# Patient Record
Sex: Male | Born: 1980
Health system: Southern US, Community
[De-identification: ages and names within clinical notes are randomized; demographics above are authoritative.]

## PROBLEM LIST (undated history)

## (undated) DIAGNOSIS — Z8619 Personal history of other infectious and parasitic diseases: Secondary | ICD-10-CM

## (undated) DIAGNOSIS — B360 Pityriasis versicolor: Secondary | ICD-10-CM

## (undated) DIAGNOSIS — R61 Generalized hyperhidrosis: Secondary | ICD-10-CM

## (undated) DIAGNOSIS — C439 Malignant melanoma of skin, unspecified: Secondary | ICD-10-CM

## (undated) HISTORY — DX: Pityriasis versicolor: B36.0

## (undated) HISTORY — DX: Generalized hyperhidrosis: R61

## (undated) HISTORY — DX: Personal history of other infectious and parasitic diseases: Z86.19

## (undated) HISTORY — DX: Malignant melanoma of skin, unspecified: C43.9

---
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
## 2017-03-13 ENCOUNTER — Encounter (INDEPENDENT_AMBULATORY_CARE_PROVIDER_SITE_OTHER): Payer: Self-pay

## 2017-03-13 ENCOUNTER — Ambulatory Visit (INDEPENDENT_AMBULATORY_CARE_PROVIDER_SITE_OTHER): Payer: PRIVATE HEALTH INSURANCE | Admitting: Podiatry

## 2017-03-13 ENCOUNTER — Encounter: Payer: Self-pay | Admitting: Podiatry

## 2017-03-13 DIAGNOSIS — M779 Enthesopathy, unspecified: Secondary | ICD-10-CM

## 2017-03-13 DIAGNOSIS — M7661 Achilles tendinitis, right leg: Secondary | ICD-10-CM

## 2017-03-13 MED ORDER — MELOXICAM 15 MG PO TABS: 15.0000 mg | ORAL_TABLET | Freq: Every day | ORAL | 2 refills | Status: DC

## 2017-03-13 NOTE — Patient Instructions (Signed)
Posterior Tibialis Tendinosis Posterior tibialis tendinosis is irritation and degeneration of a tendon called the posterior tibial tendon. Your posterior tibial tendon is a cord-like tissue that connects bones of your lower leg and foot to a muscle that:  Supports your arch.  Helps you raise up on your toes.  Helps you turn your foot down and in. This condition causes foot and ankle pain and can lead to a flat foot. What are the causes? This condition is most often caused by repeated stress to the tendon (overuse injury). It can also be caused by a sudden injury that stresses the tendon, such as landing on your foot after jumping or falling. What increases the risk? This condition is more likely to develop in:  People who play a sport that involves putting a lot of pressure on the feet, such as:  Basketball.  Tennis.  Soccer.  Hockey.  Runners.  Females who are older than 40 years and are overweight.  People with diabetes.  People with decreased foot stability (ligamentous laxity).  People with flat feet. What are the signs or symptoms? Symptoms of this condition may start suddenly or gradually. Symptoms include:  Pain in the inner ankle.  Pain at the arch of your foot.  Pain that gets worse with running, walking, or standing.  Swelling on the inside of your ankle and foot.  Weakness in your ankle or foot.  Inability to stand up on tiptoe. How is this diagnosed? This condition may be diagnosed based on:  Your symptoms.  Your medical history.  A physical exam.  Tests, such as:  An X-ray.  MRI.  An ultrasound. During the physical exam, your health care provider may move your foot and ankle, test your strength and balance, and check the arch of your foot while you stand or walk. How is this treated? This condition may be treated by:  Replacing high-impact exercise with low-impact exercise, such as swimming or cycling.  Applying ice to the injured  area.  Taking an anti-inflammatory pain medicine.  Physical therapy.  Wearing a special shoe or shoe insert to support your arch (orthotic). If your symptoms do not improve with these treatments, you may need to wear a splint, removable walking boot, or short leg cast for 6-8 weeks to keep your foot and ankle still. Follow these instructions at home: If you have a boot or splint:   Wear the boot or splint as told by your health care provider. Remove it only as told by your health care provider.  Do not use your foot to support (bear) your full body weight until your health care provider says that you can.  Loosen the boot or splint if your toes tingle, become numb, or turn cold and blue.  Keep the boot or splint clean.  If your boot or splint is not waterproof:  Do not let it get wet.  Cover it with a watertight plastic bag when you take a bath or shower. If you have a cast:   Do not stick anything inside the cast to scratch your skin. Doing that increases your risk of infection.  Check the skin around the cast every day. Tell your health care provider about any concerns.  You may put lotion on dry skin around the edges of the cast. Do not apply lotion to the skin underneath the cast.  Keep the cast clean.  Do not take baths, swim, or use a hot tub until your health care provider approves. Ask   your health care provider if you can take showers. You may only be allowed to take sponge baths for bathing.  If your cast is not waterproof:  Do not let it get wet.  Cover it with a watertight plastic bag while you take a bath or a shower. Managing pain and swelling   Take over-the-counter and prescription medicines only as told by your health care provider.  If directed, apply ice to the injured area:  Put ice in a plastic bag.  Place a towel between your skin and the bag.  Leave the ice on for 20 minutes, 2-3 times a day.  Raise (elevate) your ankle above the level of  your heart when resting if you have swelling. Activity   Do not do activities that make pain or swelling worse.  Return to full activity gradually as symptoms improve.  Do exercises as told by your health care provider. General instructions   If you have an orthotic, use it as told by your health care provider.  Keep all follow-up visits as told by your health care provider. This is important. How is this prevented?  Wear footwear that is appropriate to your athletic activity.  Avoid athletic activities that cause pain or swelling in your ankle or foot.  Before being active, do range-of-motion and stretching exercises.  If you develop pain or swelling while training, stop training.  If you have pain or swelling that does not improve after a few days of rest, see your health care provider.  If you start a new athletic activity, start gradually so you can build up your strength and flexibility. Contact a health care provider if:  Your symptoms get worse.  Your symptoms do not improve in 6-8 weeks.  You develop new, unexplained symptoms.  Your splint, boot, or cast gets damaged. This information is not intended to replace advice given to you by your health care provider. Make sure you discuss any questions you have with your health care provider. Document Released: 10/09/2005 Document Revised: 06/13/2016 Document Reviewed: 06/25/2015 Elsevier Interactive Patient Education  2017 [employer]   Achilles Tendinitis Rehab Ask your health care provider which exercises are safe for you. Do exercises exactly as told by your health care provider and adjust them as directed. It is normal to feel mild stretching, pulling, tightness, or discomfort as you do these exercises, but you should stop right away if you feel sudden pain or your pain gets worse. Do not begin these exercises until told by your health care provider. Stretching and range of motion exercises These exercises warm up  your muscles and joints and improve the movement and flexibility of your ankle. These exercises also help to relieve pain, numbness, and tingling. Exercise A: Standing wall calf stretch, knee straight   1. Stand with your hands against a wall. 2. Extend your __________ leg behind you and bend your front knee slightly. Keep both of your heels on the floor. 3. Point the toes of your back foot slightly inward. 4. Keeping your heels on the floor and your back knee straight, shift your weight toward the wall. Do not allow your back to arch. You should feel a gentle stretch in your calf. 5. Hold this position for seconds. Repeat __________ times. Complete this stretch __________ times per day. Exercise B: Standing wall calf stretch, knee bent  1. Stand with your hands against a wall. 2. Extend your __________ leg behind you, and bend your front knee slightly. Keep both of  your heels on the floor. 3. Point the toes of your back foot slightly inward. 4. Keeping your heels on the floor, unlock your back knee so that it is bent. You should feel a gentle stretch deep in your calf. 5. Hold this position for __________ seconds. Repeat __________ times. Complete this stretch __________ times per day. Strengthening exercises These exercises build strength and control of your ankle. Endurance is the ability to use your muscles for a long time, even after they get tired. Exercise C: Plantar flexion with band   1. Sit on the floor with your __________ leg extended. You may put a pillow under your calf to give your foot more room to move. 2. Loop a rubber exercise band or tube around the ball of your __________ foot. The ball of your foot is on the walking surface, right under your toes. The band or tube should be slightly tense when your foot is relaxed. If the band or tube slips, you can put on your shoe or put a washcloth between the band and your foot to help it stay in place. 3. Slowly point your toes  downward, pushing them away from you. 4. Hold this position for __________ seconds. 5. Slowly release the tension in the band or tube, controlling smoothly until your foot is back to the starting position. Repeat __________ times. Complete this exercise __________ times per day. Exercise D: Heel raise with eccentric lower   1. Stand on a step with the balls of your feet. The ball of your foot is on the walking surface, right under your toes.  Do not put your heels on the step.  For balance, rest your hands on the wall or on a railing. 2. Rise up onto the balls of your feet. 3. Keeping your heels up, shift all of your weight to your __________ leg and pick up your other leg. 4. Slowly lower your __________ leg so your heel drops below the level of the step. 5. Put down your foot. If told by your health care provider, build up to:  3 sets of 15 repetitions while keeping your knees straight.  3 sets of 15 repetitions while keeping your knees bent as far as told by your health care provider. Complete this exercise __________ times per day. If this exercise is too easy, try doing it while wearing a backpack with weights in it. Balance exercises These exercises improve or maintain your balance. Balance is important in preventing falls. Exercise E: Single leg stand  1. Without shoes, stand near a railing or in a door frame. Hold on to the railing or door frame as needed. 2. Stand on your __________ foot. Keep your big toe down on the floor and try to keep your arch lifted. 3. Hold this position for __________ seconds. Repeat __________ times. Complete this exercise __________ times per day. If this exercise is too easy, you can try it with your eyes closed or while standing on a pillow. This information is not intended to replace advice given to you by your health care provider. Make sure you discuss any questions you have with your health care provider. Document Released: 05/10/2005 Document  Revised: 06/15/2016 Document Reviewed: 06/15/2015 Elsevier Interactive Patient Education  2017 [employer]

## 2017-03-13 NOTE — Progress Notes (Signed)
Subjective:    Patient ID: Maurice Roberts, male   DOB: 36 y.o.   MRN: [id number]   HPI 36 year old male presents the also concerns of pain to the right Achilles tendon into the ankle as well. Its been ongoing for about 1 year. He states that he would like to run but he has difficulty running and a lot of activity due to the pain. He states he only has tenderness with activity has no pain at rest or today. He has notany swelling he denies any recent injury or trauma. There is no numbness or tingling. He said no recent treatment. The pain does not wake him up at night. He has no other complaints today.   Review of Systems  All other systems reviewed and are negative.       Objective:  Physical Exam General: AAO x3, NAD  Dermatological: Skin is warm, dry and supple bilateral. Nails x 10 are well manicured; remaining integument appears unremarkable at this time. There are no open sores, no preulcerative lesions, no rash or signs of infection present.  Vascular: Dorsalis Pedis artery and Posterior Tibial artery pedal pulses are 2/4 bilateral with immedate capillary fill time. Pedal hair growth present. No varicosities and no lower extremity edema present bilateral. There is no pain with calf compression, swelling, warmth, erythema.   Neruologic: Grossly intact via light touch bilateral. Vibratory intact via tuning fork bilateral. Protective threshold with Semmes Wienstein monofilament intact to all pedal sites bilateral. Negative tinel sign.   Musculoskeletal: Upon weightbearing exam there is no area of tenderness in the bilateral lower extremity is. Ankle, subtalar, midtarsal joint range of motion intact. Mild equinus is present. There is no tenderness along the course of the flexor tendons, posterior tibial tendon there is no pain on the Achilles tendon today. There is no overlying edema, erythema, increase in warmth. Thompson test is negative. Is no defect noted within the Achilles tendon.  Weightbearing evaluation does reveal in rectus right foot however on the left side he does roll in more and there is bowing of the Achilles tendon. Muscular strength 5/5 in all groups tested bilateral.  Gait: Unassisted, Nonantalgic.      Assessment:     36 year old male right Achilles tendon asking likely posterior tibial tendinitis due to biomechanical changes    Plan:      -Treatment options discussed including all alternatives, risks, and complications -Etiology of symptoms were discussed -Prescribed mobic. He can use this prn. Discussed side effects of the medication and directed to stop if any are to occur and call the office.  -I believe his symptoms are due to biomechanical changes. The right foot is rectus in the left footis rolling and more and there is flattening of the arch and rolling inward of the ankle. I believe he'll benefit more from a orthotic. He was measured for orthotics. Will check insurance coverage.  -Rehab exercises discussed. If symptoms continue discussed possible MRI, formal PT.  -If symptoms continue, will get x-rays of the ankle  -RTC 4 weeks or sooner if needed.  Ovid CurdMatthew Phoenix Dresser, DPM

## 2017-03-13 NOTE — Progress Notes (Signed)
   Subjective:    Patient ID: Christie BeckersLuke Tengan, male    DOB: Jul 05, 1981, 36 y.o.   MRN: [id number]  HPI   I have some pain and on inside of right ankle that wraps around the achilles and has been going on for about a year and worse the last 3 months and sore and tender and hurts on uneven surfaces and there is no swelling     Review of Systems  All other systems reviewed and are negative.      Objective:   Physical Exam        Assessment & Plan:

## 2017-03-15 ENCOUNTER — Telehealth: Payer: Self-pay | Admitting: *Deleted

## 2017-03-15 NOTE — Telephone Encounter (Signed)
I had Maurice Roberts call the [employer] insurance and they do not cover the orthotics and I called the patient and stated that the insurance is not going to cover them and the patient stated that he would have to discuss it with his wife and he was quoted the price of $398.00 if the insurance did not cover. Misty StanleyLisa

## 2017-03-22 ENCOUNTER — Telehealth: Payer: Self-pay | Admitting: Behavioral Health

## 2017-03-22 NOTE — Telephone Encounter (Signed)
Patient returning call patient states he will be in meetings from 2p to 3:3pm, please advise

## 2017-03-22 NOTE — Telephone Encounter (Signed)
Unable to reach patient at time of Pre-Visit Call.  Left message for patient to return call when available.    

## 2017-03-22 NOTE — Telephone Encounter (Signed)
LM x 2 for pre-visit call. 

## 2017-03-23 ENCOUNTER — Encounter: Payer: Self-pay | Admitting: Family Medicine

## 2017-03-23 ENCOUNTER — Ambulatory Visit (INDEPENDENT_AMBULATORY_CARE_PROVIDER_SITE_OTHER): Payer: PRIVATE HEALTH INSURANCE | Admitting: Family Medicine

## 2017-03-23 VITALS — BP 100/70 | HR 60 | Temp 98.2°F | Ht 68.0 in | Wt 213.4 lb

## 2017-03-23 DIAGNOSIS — E6609 Other obesity due to excess calories: Secondary | ICD-10-CM | POA: Diagnosis not present

## 2017-03-23 DIAGNOSIS — Z6832 Body mass index (BMI) 32.0-32.9, adult: Secondary | ICD-10-CM | POA: Diagnosis not present

## 2017-03-23 DIAGNOSIS — L74519 Primary focal hyperhidrosis, unspecified: Secondary | ICD-10-CM

## 2017-03-23 DIAGNOSIS — R61 Generalized hyperhidrosis: Secondary | ICD-10-CM

## 2017-03-23 NOTE — Progress Notes (Signed)
Chief Complaint  Patient presents with  . Establish Care    pt states no concerns       New Patient Visit SUBJECTIVE: HPI: Maurice Roberts is an 36 y.o.male who is being seen for establishing care.  The patient was previously seen at [place] office in [place].  Patient has situational anxiety with flight for which he takes Ativan as needed for.  He has gained 30 pounds since turning 36 years old and is undergoing efforts to improve his diet and become more active. He would like to get to 185 pounds. He is not having any chest pain or shortness of breath.  The patient also has a history of hyperhidrosis. He has used aluminum hydroxide in the past that does help, however does burn.  Allergies  Allergen Reactions  . Penicillins     Past Medical History:  Diagnosis Date  . History of chicken pox   . Hyperhidrosis   . Tinea versicolor    Past Surgical History:  Procedure Laterality Date  . KNEE SURGERY  1993   Social History   Social History  . Marital status: Married   Social History Main Topics  . Smoking status: Former Smoker    Types: Cigarettes    Quit date: 03/23/2002  . Smokeless tobacco: Never Used  . Alcohol use 1.2 oz/week    2 Cans of beer per week     Comment: Pt now drinking 5 per week  . Drug use: No   Family History  Problem Relation Age of Onset  . Cancer Maternal Grandmother        Breast      Current Outpatient Prescriptions:  .  LORazepam (ATIVAN) 1 MG tablet, Take 1 tablet by mouth before flying., Disp: , Rfl:   ROS Endo: +sweating   OBJECTIVE: BP 100/70 (BP Location: Left Arm, Patient Position: Sitting, Cuff Size: Large)   Pulse 60   Temp 98.2 F (36.8 C) (Oral)   Ht 5\' 8"  (1.727 m)   Wt 213 lb 6.4 oz (96.8 kg)   SpO2 98%   BMI 32.45 kg/m   Constitutional: -  VS reviewed -  Well developed, well nourished, appears stated age -  No apparent distress  Psychiatric: -  Oriented to person, place, and time -  Memory intact -  Affect and mood  normal -  Fluent conversation, good eye contact -  Judgment and insight age appropriate  Eye: -  Conjunctivae clear, no discharge -  Pupils symmetric, round, reactive to light  Neck: -  No gross swelling, no palpable masses -  Thyroid midline, not enlarged, mobile, no palpable masses  Cardiovascular: -  RRR, no murmurs -  No LE edema  Respiratory: -  Normal respiratory effort, no accessory muscle use, no retraction -  Breath sounds equal, no wheezes, no ronchi, no crackles   ASSESSMENT/PLAN: Class 1 obesity due to excess calories without serious comorbidity with body mass index (BMI) of 32.0 to 32.9 in adult  Hyperhidrosis  Counseled on diet and exercise. Healthy diet handout provided in paper work. Doing well overall, okay to continue to use aluminum hydroxide for hyperhidrosis as needed. Patient should return in around 10 months for a physical. The patient voiced understanding and agreement to the plan.   Jilda Rocheicholas Paul SomonaukWendling, DO 03/23/17  12:40 PM

## 2017-03-23 NOTE — Patient Instructions (Addendum)
OK to use aluminum hydroxide.  Healthy Eating Plan Many factors influence your heart health, including eating and exercise habits. Heart (coronary) risk increases with abnormal blood fat (lipid) levels. Heart-healthy meal planning includes limiting unhealthy fats, increasing healthy fats, and making other small dietary changes. This includes maintaining a healthy body weight to help keep lipid levels within a normal range.  WHAT IS MY PLAN?  Your health care provider recommends that you:  Drink a glass of water before meals to help with satiety.  Eat slowly.  An alternative to the water is to add Metamucil. This will help with satiety as well. It does contain calories, unlike water.  WHAT TYPES OF FAT SHOULD I CHOOSE?  Choose healthy fats more often. Choose monounsaturated and polyunsaturated fats, such as olive oil and canola oil, flaxseeds, walnuts, almonds, and seeds.  Eat more omega-3 fats. Good choices include salmon, mackerel, sardines, tuna, flaxseed oil, and ground flaxseeds. Aim to eat fish at least two times each week.  Avoid foods with partially hydrogenated oils in them. These contain trans fats. Examples of foods that contain trans fats are stick margarine, some tub margarines, cookies, crackers, and other baked goods. If you are going to avoid a fat, this is the one to avoid!  WHAT GENERAL GUIDELINES DO I NEED TO FOLLOW?  Check food labels carefully to identify foods with trans fats. Avoid these types of options when possible.  Fill one half of your plate with vegetables and green salads. Eat 4-5 servings of vegetables per day. A serving of vegetables equals 1 cup of raw leafy vegetables,  cup of raw or cooked cut-up vegetables, or  cup of vegetable juice.  Fill one fourth of your plate with whole grains. Look for the word "whole" as the first word in the ingredient list.  Fill one fourth of your plate with lean protein foods.  Eat 4-5 servings of fruit per day. A  serving of fruit equals one medium whole fruit,  cup of dried fruit,  cup of fresh, frozen, or canned fruit. Try to avoid fruits in cups/syrups as the sugar content can be high.  Eat more foods that contain soluble fiber. Examples of foods that contain this type of fiber are apples, broccoli, carrots, beans, peas, and barley. Aim to get 20-30 g of fiber per day.  Eat more home-cooked food and less restaurant, buffet, and fast food.  Limit or avoid alcohol.  Limit foods that are high in starch and sugar.  Avoid fried foods when able.  Cook foods by using methods other than frying. Baking, boiling, grilling, and broiling are all great options. Other fat-reducing suggestions include: ? Removing the skin from poultry. ? Removing all visible fats from meats. ? Skimming the fat off of stews, soups, and gravies before serving them. ? Steaming vegetables in water or broth.  Lose weight if you are overweight. Losing just 5-10% of your initial body weight can help your overall health and prevent diseases such as diabetes and heart disease.  Increase your consumption of nuts, legumes, and seeds to 4-5 servings per week. One serving of dried beans or legumes equals  cup after being cooked, one serving of nuts equals 1 ounces, and one serving of seeds equals  ounce or 1 tablespoon.  WHAT ARE GOOD FOODS CAN I EAT? Grains Grainy breads (try to find bread that is 3 g of fiber per slice or greater), oatmeal, light popcorn. Whole-grain cereals. Rice and pasta, including brown rice and those  that are made with whole wheat. Edamame pasta is a great alternative to grain pasta. It has a higher protein content. Try to avoid significant consumption of white bread, sugary cereals, or pastries/baked goods.  Vegetables All vegetables. Cooked white potatoes do not count as vegetables.  Fruits All fruits, but limit pineapple and bananas as these fruits have a higher sugar content.  Meats and Other Protein  Sources Lean, well-trimmed beef, veal, pork, and lamb. Chicken and [place] without skin. All fish and shellfish. Wild duck, rabbit, pheasant, and venison. Egg whites or low-cholesterol egg substitutes. Dried beans, peas, lentils, and tofu.Seeds and most nuts.  Dairy Low-fat or nonfat cheeses, including ricotta, string, and mozzarella. Skim or 1% milk that is liquid, powdered, or evaporated. Buttermilk that is made with low-fat milk. Nonfat or low-fat yogurt. Soy/Almond milk are good alternatives if you cannot handle dairy.  Beverages Water is the best for you. Sports drinks with less sugar are more desirable unless you are a highly active athlete.  Sweets and Desserts Sherbets and fruit ices. Honey, jam, marmalade, jelly, and syrups. Dark chocolate.  Eat all sweets and desserts in moderation.  Fats and Oils Nonhydrogenated (trans-free) margarines. Vegetable oils, including soybean, sesame, sunflower, olive, peanut, safflower, corn, canola, and cottonseed. Salad dressings or mayonnaise that are made with a vegetable oil. Limit added fats and oils that you use for cooking, baking, salads, and as spreads.  Other Cocoa powder. Coffee and tea. Most condiments.  The items listed above may not be a complete list of recommended foods or beverages. Contact your dietitian for more options.

## 2017-04-10 ENCOUNTER — Ambulatory Visit: Payer: PRIVATE HEALTH INSURANCE | Admitting: Podiatry

## 2017-09-23 ENCOUNTER — Emergency Department (HOSPITAL_COMMUNITY)
Admission: EM | Admit: 2017-09-23 | Discharge: 2017-09-23 | Disposition: A | Discharge status: Home / Self Care | Payer: BLUE CROSS/BLUE SHIELD | Attending: Emergency Medicine | Admitting: Emergency Medicine

## 2017-09-23 ENCOUNTER — Encounter (HOSPITAL_COMMUNITY): Payer: Self-pay

## 2017-09-23 DIAGNOSIS — Z880 Allergy status to penicillin: Secondary | ICD-10-CM | POA: Diagnosis not present

## 2017-09-23 DIAGNOSIS — Z79899 Other long term (current) drug therapy: Secondary | ICD-10-CM | POA: Diagnosis not present

## 2017-09-23 DIAGNOSIS — M5431 Sciatica, right side: Secondary | ICD-10-CM | POA: Insufficient documentation

## 2017-09-23 DIAGNOSIS — Z87891 Personal history of nicotine dependence: Secondary | ICD-10-CM | POA: Insufficient documentation

## 2017-09-23 DIAGNOSIS — M545 Low back pain: Secondary | ICD-10-CM | POA: Diagnosis present

## 2017-09-23 MED ORDER — KETOROLAC TROMETHAMINE 60 MG/2ML IM SOLN
30.0000 mg | Freq: Once | INTRAMUSCULAR | Status: AC
  Administered 2017-09-23: 30 mg via INTRAMUSCULAR
  Filled 2017-09-23: qty 2

## 2017-09-23 MED ORDER — NAPROXEN SODIUM 220 MG PO TABS: 220.0000 mg | ORAL_TABLET | Freq: Two times a day (BID) | ORAL | 0 refills | Status: AC

## 2017-09-23 MED ORDER — CYCLOBENZAPRINE HCL 10 MG PO TABS: 10.0000 mg | ORAL_TABLET | Freq: Every day | ORAL | 0 refills | Status: AC

## 2017-09-23 MED ORDER — CYCLOBENZAPRINE HCL 10 MG PO TABS
5.0000 mg | ORAL_TABLET | Freq: Once | ORAL | Status: AC
  Administered 2017-09-23: 5 mg via ORAL
  Filled 2017-09-23: qty 1

## 2017-09-23 NOTE — Discharge Instructions (Signed)
You may use over-the-counter Motrin (Ibuprofen), Acetaminophen (Tylenol), topical muscle creams such as SalonPas, Icy Hot, Bengay, etc. Please stretch, apply heat, and have massage therapy for additional assistance. ° °

## 2017-09-23 NOTE — ED Triage Notes (Signed)
Patient complains of severe lower back pain after bending over yesterday, has hx of lumbar problem in past, pain with any movement, alert and oriented

## 2017-09-23 NOTE — ED Provider Notes (Signed)
[facility] EMERGENCY DEPARTMENT Provider Note  CSN: [id number] Arrival date & time: 09/23/17 0750  Chief Complaint(s) Back Pain  HPI Maurice Roberts is a 36 y.o. male with a history of L5 herniated disc noted in early 2000 that was treated with chiropractic and rehab presents to the emergency department with right lumbosacral pain that began yesterday after bending over to pick up close off the bed.  Pain was sudden and severe in nature.  Aching/cramping/shooting.  Endorses radiation to the right foot.  Exacerbated with movement, palpation, ambulation.  Also exacerbated with certain positions.  Alleviated by being still.  Has tried taking over-the-counter Aleve and Motrin with minimal relief.  Is also applied ice and Biofreeze.  Denies any trauma, history of cancer, fevers, IV drug use, bladder/bowel incontinence, lower extremity weakness or loss of sensation.  HPI  Past Medical History Past Medical History:  Diagnosis Date  . History of chicken pox   . Hyperhidrosis   . Tinea versicolor    There are no active problems to display for this patient.  Home Medication(s) Prior to Admission medications   Medication Sig Start Date End Date Taking? Authorizing Provider  cyclobenzaprine (FLEXERIL) 10 MG tablet Take 1 tablet (10 mg total) by mouth at bedtime for 10 days. 09/23/17 10/03/17  Nira Conn, MD  LORazepam (ATIVAN) 1 MG tablet Take 1 tablet by mouth before flying.    [provider]  naproxen sodium (ALEVE) 220 MG tablet Take 1-2 tablets (220-440 mg total) by mouth 2 (two) times daily with a meal for 10 days. 09/23/17 10/03/17  Nira Conn, MD                                                                                                                                    Past Surgical History Past Surgical History:  Procedure Laterality Date  . KNEE SURGERY  1993   Family History Family History  Problem Relation Age of Onset  . Cancer  Maternal Grandmother        Breast     Social History Social History   Tobacco Use  . Smoking status: Former Smoker    Types: Cigarettes    Last attempt to quit: 03/23/2002    Years since quitting: 15.5  . Smokeless tobacco: Never Used  Substance Use Topics  . Alcohol use: Yes    Alcohol/week: 1.2 oz    Types: 2 Cans of beer per week    Comment: Pt now drinking 5 per week  . Drug use: No   Allergies Penicillins  Review of Systems Review of Systems All other systems are reviewed and are negative for acute change except as noted in the HPI  Physical Exam Vital Signs  I have reviewed the triage vital signs BP (!) 131/97   Pulse 89   Temp 99.1 F (37.3 C) (Oral)   Resp 18   SpO2 99%  Physical Exam  Constitutional: He is oriented to person, place, and time. He appears well-developed and well-nourished. No distress.  HENT:  Head: Normocephalic and atraumatic.  Right Ear: External ear normal.  Left Ear: External ear normal.  Nose: Nose normal.  Mouth/Throat: Mucous membranes are normal. No trismus in the jaw.  Eyes: Conjunctivae and EOM are normal. No scleral icterus.  Neck: Normal range of motion and phonation normal.  Cardiovascular: Normal rate and regular rhythm.  Pulmonary/Chest: Effort normal. No stridor. No respiratory distress.  Abdominal: He exhibits no distension.  Musculoskeletal: Normal range of motion. He exhibits no edema.       Lumbar back: He exhibits tenderness and spasm. He exhibits no bony tenderness.       Back:  Neurological: He is alert and oriented to person, place, and time.  Spine Exam: Strength: 5/5 throughout LE bilaterally (hip flexion/extension, adduction/abduction; knee flexion/extension; foot dorsiflexion/plantarflexion, inversion/eversion; great toe inversion) Sensation: Intact to light touch in proximal and distal LE bilaterally Reflexes: 2+ quadriceps and achilles reflexes   Skin: He is not diaphoretic.  Psychiatric: He has a  normal mood and affect. His behavior is normal.  Vitals reviewed.   ED Results and Treatments Labs (all labs ordered are listed, but only abnormal results are displayed) Labs Reviewed - No data to display                                                                                                                       EKG  EKG Interpretation  Date/Time:    Ventricular Rate:    PR Interval:    QRS Duration:   QT Interval:    QTC Calculation:   R Axis:     Text Interpretation:        Radiology No results found. Pertinent labs & imaging results that were available during my care of the patient were reviewed by me and considered in my medical decision making (see chart for details).  Medications Ordered in ED Medications  ketorolac (TORADOL) injection 30 mg (not administered)  cyclobenzaprine (FLEXERIL) tablet 5 mg (not administered)                                                                                                                                    Procedures Procedures  (including critical care time)  Medical Decision Making / ED Course I have reviewed the nursing notes for this encounter  and the patient's prior records (if available in EHR or on provided paperwork).    36 y.o. male presents with back pain in lumbar area for 2 days with signs of radicular pain. No acute traumatic onset. No red flag symptoms of fever, weight loss, saddle anesthesia, weakness, fecal/urinary incontinence or urinary retention.   Suspect muscle strain/spasm versus herniated disc etiology. No indication for imaging emergently. Patient was recommended to take short course of scheduled NSAIDs and engage in early mobility as definitive treatment. Return precautions discussed for worsening or new concerning symptoms.   The patient is safe for discharge with strict return precautions.   Final Clinical Impression(s) / ED Diagnoses Final diagnoses:  Sciatica of right side     Disposition: Discharge  Condition: Good  I have discussed the results, Dx and Tx plan with the patient who expressed understanding and agree(s) with the plan. Discharge instructions discussed at great length. The patient was given strict return precautions who verbalized understanding of the instructions. No further questions at time of discharge.    ED Discharge Orders        Ordered    naproxen sodium (ALEVE) 220 MG tablet  2 times daily with meals     09/23/17 1029    cyclobenzaprine (FLEXERIL) 10 MG tablet  Daily at bedtime     09/23/17 1029       Follow Up: Sharlene DoryWendling, Nicholas Paul, DO [address] [place] [phone]  Schedule an appointment as soon as possible for a visit in 2 weeks If symptoms do not improve or  worsen     This chart was dictated using voice recognition software.  Despite best efforts to proofread,  errors can occur which can change the documentation meaning.   Nira Connardama, Tyrice Hewitt Eduardo, MD 09/23/17 [phone]

## 2018-01-04 ENCOUNTER — Ambulatory Visit: Payer: PRIVATE HEALTH INSURANCE | Admitting: Family Medicine

## 2018-01-22 ENCOUNTER — Ambulatory Visit (INDEPENDENT_AMBULATORY_CARE_PROVIDER_SITE_OTHER): Payer: BLUE CROSS/BLUE SHIELD

## 2018-01-22 ENCOUNTER — Ambulatory Visit: Payer: PRIVATE HEALTH INSURANCE | Admitting: Podiatry

## 2018-01-22 ENCOUNTER — Encounter: Payer: Self-pay | Admitting: Podiatry

## 2018-01-22 VITALS — BP 124/83 | HR 76 | Resp 16

## 2018-01-22 DIAGNOSIS — M778 Other enthesopathies, not elsewhere classified: Secondary | ICD-10-CM

## 2018-01-22 DIAGNOSIS — M779 Enthesopathy, unspecified: Secondary | ICD-10-CM

## 2018-01-22 DIAGNOSIS — M7751 Other enthesopathy of right foot: Secondary | ICD-10-CM | POA: Diagnosis not present

## 2018-01-22 MED ORDER — MELOXICAM 15 MG PO TABS
15.0000 mg | ORAL_TABLET | Freq: Every day | ORAL | 3 refills | Status: DC
Start: 2018-01-22 — End: 2018-04-24

## 2018-01-22 MED ORDER — METHYLPREDNISOLONE 4 MG PO TBPK: ORAL_TABLET | ORAL | 0 refills | Status: DC

## 2018-01-22 NOTE — Progress Notes (Signed)
  Subjective:  Patient ID: Maurice Roberts, male    DOB: 08/04/1981,  MRN: [id number] HPI Chief Complaint  Patient presents with  . Ankle Pain    Medial ankle right - initially started in achilles and now has moved into ankle - 9 months, no injury, says right leg is under developed "missing ligament", tried PT at home-not much help, [facility]    37 y.o. male presents with the above complaint.   ROS: Denies fever chills nausea vomiting muscle aches pains calf pain chest pain shortness of breath.  Past Medical History:  Diagnosis Date  . History of chicken pox   . Hyperhidrosis   . Tinea versicolor    Past Surgical History:  Procedure Laterality Date  . KNEE SURGERY  1993    Current Outpatient Medications:  .  meloxicam (MOBIC) 15 MG tablet, Take 1 tablet (15 mg total) by mouth daily., Disp: 30 tablet, Rfl: 3 .  methylPREDNISolone (MEDROL DOSEPAK) 4 MG TBPK tablet, 6 day dose pack - take as directed, Disp: 21 tablet, Rfl: 0  Allergies  Allergen Reactions  . Penicillins    Review of Systems Objective:   Vitals:   01/22/18 1447  BP: 124/83  Pulse: 76  Resp: 16    General: Well developed, nourished, in no acute distress, alert and oriented x3   Dermatological: Skin is warm, dry and supple bilateral. Nails x 10 are well maintained; remaining integument appears unremarkable at this time. There are no open sores, no preulcerative lesions, no rash or signs of infection present.  Vascular: Dorsalis Pedis artery and Posterior Tibial artery pedal pulses are 2/4 bilateral with immedate capillary fill time. Pedal hair growth present. No varicosities and no lower extremity edema present bilateral.   Neruologic: Grossly intact via light touch bilateral. Vibratory intact via tuning fork bilateral. Protective threshold with Semmes Wienstein monofilament intact to all pedal sites bilateral. Patellar and Achilles deep tendon reflexes 2+ bilateral. No Babinski or clonus  noted bilateral.   Musculoskeletal: No gross boney pedal deformities bilateral. No pain, crepitus, or limitation noted with foot and ankle range of motion bilateral. Muscular strength 5/5 in all groups tested bilateral.  Gait: Unassisted, Nonantalgic.    Radiographs:  Radiographs taken today demonstrate an osseously mature individual.  There is a small fracture fragment or accessory ossicle to the dorsal aspect of the talus of the right foot.  The AP view of the ankle demonstrates a ball-and-socket type joint destruction however lateral view does demonstrate the typical anterior posterior ankle socket.  There appears to be some fraying or lipping of the margins of the medial malleolus anteriorly.  Nothing strikingly appears abnormal otherwise.  Assessment & Plan:   Assessment: Capsulitis possible early osteoarthritis ankle right.  Medial gutter.  Plan: Discussed etiology pathology conservative versus surgical therapies.  At this point after sterile Betadine skin prep I injected the medial gutter with Kenalog 20 mg and 5 mg of Marcaine point maximal tenderness.  Start him on a 4 mg Medrol Dosepak to be followed by 15 mg of meloxicam after completing the initial pack.  I also would like to see him into a pair of orthotics and we are requesting precertification from insurance.  I will follow-up with him in a  month     Max T. AshawayHyatt, [place] Dakota[place]

## 2018-01-29 ENCOUNTER — Ambulatory Visit (INDEPENDENT_AMBULATORY_CARE_PROVIDER_SITE_OTHER): Payer: BLUE CROSS/BLUE SHIELD | Admitting: Orthotics

## 2018-01-29 DIAGNOSIS — M778 Other enthesopathies, not elsewhere classified: Secondary | ICD-10-CM

## 2018-01-29 DIAGNOSIS — M7750 Other enthesopathy of unspecified foot: Secondary | ICD-10-CM

## 2018-01-29 DIAGNOSIS — M779 Enthesopathy, unspecified: Secondary | ICD-10-CM

## 2018-01-29 NOTE — Progress Notes (Signed)
Pateint presents today based upon recommendation Dr Al CorpusHyatt for f/o.  Patient has hx of achillies ankle pain moving anteriory along medial mall super toward dorsum.   Patietn would beneift from arch hugging semi rigid device with 4* varus RF post and 1/8" lift RIGHT.

## 2018-02-19 ENCOUNTER — Ambulatory Visit: Payer: BLUE CROSS/BLUE SHIELD | Admitting: Orthotics

## 2018-02-19 DIAGNOSIS — M7661 Achilles tendinitis, right leg: Secondary | ICD-10-CM

## 2018-02-19 DIAGNOSIS — M779 Enthesopathy, unspecified: Secondary | ICD-10-CM

## 2018-02-19 NOTE — Progress Notes (Signed)
Patient came in today to pick up custom made foot orthotics.  The goals were accomplished and the patient reported no dissatisfaction with said orthotics.  Patient was advised of breakin period and how to report any issues. 

## 2018-03-21 ENCOUNTER — Telehealth: Payer: Self-pay | Admitting: Podiatry

## 2018-03-21 DIAGNOSIS — M7750 Other enthesopathy of unspecified foot: Secondary | ICD-10-CM

## 2018-03-21 NOTE — Telephone Encounter (Signed)
Pt called and left voicemail asking about ordering a second pair of orthotics made like the last ones.   I called pt and pt would like to proceed with ordering a second pair and is aware they are 199.00.  Raiford Noble is to order them and I will call pt when they come in.

## 2018-04-22 ENCOUNTER — Telehealth: Payer: Self-pay | Admitting: Podiatry

## 2018-04-22 NOTE — Telephone Encounter (Signed)
Pt left message asking if we could write a letter of medical necessity for the orthotics. His flex spending account is requesting this. Is it ok for us to write this letter.

## 2018-04-23 NOTE — Telephone Encounter (Signed)
Maurice BoerVicki had templete and has printed it for pt and I am mailing it to the pt. Pt is aware

## 2018-04-23 NOTE — Telephone Encounter (Signed)
There is a template letter for this somewhere.

## 2018-04-24 ENCOUNTER — Encounter: Payer: Self-pay | Admitting: Family Medicine

## 2018-04-24 ENCOUNTER — Ambulatory Visit: Payer: BLUE CROSS/BLUE SHIELD | Admitting: Family Medicine

## 2018-04-24 ENCOUNTER — Encounter (INDEPENDENT_AMBULATORY_CARE_PROVIDER_SITE_OTHER): Payer: Self-pay

## 2018-04-24 VITALS — BP 118/86 | HR 71 | Temp 98.5°F | Ht 68.0 in | Wt 221.5 lb

## 2018-04-24 DIAGNOSIS — F418 Other specified anxiety disorders: Secondary | ICD-10-CM

## 2018-04-24 MED ORDER — LORAZEPAM 1 MG PO TABS: ORAL_TABLET | ORAL | 1 refills | Status: DC

## 2018-04-24 NOTE — Progress Notes (Signed)
Chief Complaint  Patient presents with  . Medication Refill    Subjective: Patient is a 37 y.o. male here for situational anxiety.  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  Historically, he has taken Ativan around 1 hour prior to flights which does help.  He states that 10 tablets will normally last him 1 year.  He does not take it outside of flying.   ROS: Psych: As noted in HPI  Past Medical History:  Diagnosis Date  . History of chicken pox   . Hyperhidrosis   . Tinea versicolor     Objective: BP 118/86 (BP Location: Left Arm, Patient Position: Sitting, Cuff Size: Large)   Pulse 71   Temp 98.5 F (36.9 C) (Oral)   Ht 5\' 8"  (1.727 m)   Wt 221 lb 8 oz (100.5 kg)   SpO2 96%   BMI 33.68 kg/m  General: Awake, appears stated age HEENT: MMM Lungs: No accessory muscle use Psych: Age appropriate judgment and insight, normal affect and mood  Assessment and Plan: Situational anxiety - Plan: LORazepam (ATIVAN) 1 MG tablet  Ativan given for prn use for flights. F/u as originally scheduled.  The patient voiced understanding and agreement to the plan.  Jilda Rocheicholas Paul [place], DO 04/24/18  4:57 PM

## 2018-04-24 NOTE — Progress Notes (Signed)
Pre visit review using our clinic review tool, if applicable. No additional management support is needed unless otherwise documented below in the visit note. 

## 2018-04-24 NOTE — Patient Instructions (Signed)
Let us know if you need anything.  

## 2018-05-31 ENCOUNTER — Encounter: Payer: BLUE CROSS/BLUE SHIELD | Admitting: Family Medicine

## 2018-07-29 ENCOUNTER — Encounter: Payer: Self-pay | Admitting: Family Medicine

## 2018-07-29 ENCOUNTER — Ambulatory Visit (INDEPENDENT_AMBULATORY_CARE_PROVIDER_SITE_OTHER): Payer: BLUE CROSS/BLUE SHIELD | Admitting: Family Medicine

## 2018-07-29 VITALS — BP 114/84 | HR 74 | Temp 98.5°F | Ht 68.0 in | Wt 222.4 lb

## 2018-07-29 DIAGNOSIS — Z114 Encounter for screening for human immunodeficiency virus [HIV]: Secondary | ICD-10-CM | POA: Diagnosis not present

## 2018-07-29 DIAGNOSIS — Z0000 Encounter for general adult medical examination without abnormal findings: Secondary | ICD-10-CM

## 2018-07-29 DIAGNOSIS — Z23 Encounter for immunization: Secondary | ICD-10-CM

## 2018-07-29 MED ORDER — HYDROCORTISONE 2.5 % RE CREA: 1.0000 "application " | TOPICAL_CREAM | Freq: Two times a day (BID) | RECTAL | 0 refills | Status: DC

## 2018-07-29 NOTE — Progress Notes (Signed)
Pre visit review using our clinic review tool, if applicable. No additional management support is needed unless otherwise documented below in the visit note. 

## 2018-07-29 NOTE — Progress Notes (Signed)
Chief Complaint  Patient presents with  . Annual Exam    Well Male Maurice Roberts is here for a complete physical.   His last physical was >1 year ago.  Current diet: in general, a "healthy" diet.   Current exercise: cardio, lifting Weight trend: stable  Daytime fatigue? No. Seat belt? Yes.    Health maintenance Tetanus- No HIV- No  Past Medical History:  Diagnosis Date  . History of chicken pox   . Hyperhidrosis   . Tinea versicolor      Past Surgical History:  Procedure Laterality Date  . KNEE SURGERY  1993    Medications  Current Outpatient Medications on File Prior to Visit  Medication Sig Dispense Refill  . LORazepam (ATIVAN) 1 MG tablet Take 1 tablet by mouth before flying. (Patient not taking: Reported on 07/29/2018) 20 tablet 1   Allergies Allergies  Allergen Reactions  . Penicillins     Family History Family History  Problem Relation Age of Onset  . Cancer Maternal Grandmother        Breast     Review of Systems: Constitutional: no fevers or chills Eye:  no recent significant change in vision Ear/Nose/Mouth/Throat:  Ears:  no tinnitus or hearing loss Nose/Mouth/Throat:  no complaints of nasal congestion, no sore throat Cardiovascular:  no chest pain, no palpitations Respiratory:  no cough and no shortness of breath Gastrointestinal:  no abdominal pain, no change in bowel habits GU:  Male: negative for dysuria, frequency, and incontinence and negative for prostate symptoms Musculoskeletal/Extremities: +right sided cramping; otherwise no pain, redness, or swelling of the joints Integumentary (Skin/Breast):  no abnormal skin lesions reported Neurologic:  no headaches, no numbness, tingling Endocrine: No unexpected weight changes Hematologic/Lymphatic:  no night sweats  Exam BP 114/84 (BP Location: Left Arm, Patient Position: Sitting, Cuff Size: Large)   Pulse 74   Temp 98.5 F (36.9 C) (Oral)   Ht 5\' 8"  (1.727 m)   Wt 222 lb 6 oz (100.9 kg)    SpO2 97%   BMI 33.81 kg/m  General:  well developed, well nourished, in no apparent distress Skin:  no significant moles, warts, or growths Head:  no masses, lesions, or tenderness Eyes:  pupils equal and round, sclera anicteric without injection Ears:  canals without lesions, TMs shiny without retraction, no obvious effusion, no erythema Nose:  nares patent, septum midline, mucosa normal Throat/Pharynx:  lips and gingiva without lesion; tongue and uvula midline; non-inflamed pharynx; no exudates or postnasal drainage Neck: neck supple without adenopathy, thyromegaly, or masses Lungs:  clear to auscultation, breath sounds equal bilaterally, no respiratory distress Cardio:  regular rate and rhythm, no bruits, no LE edema Abdomen:  abdomen soft, nontender; bowel sounds normal; no masses or organomegaly; neg Murphy's Genital (male): circumcised penis, no lesions or discharge; testes present bilaterally without masses or tenderness Rectal: Deferred Musculoskeletal:  symmetrical muscle groups noted without atrophy or deformity; no side pain Extremities:  no clubbing, cyanosis, or edema, no deformities, no skin discoloration Neuro:  gait normal; deep tendon reflexes normal and symmetric Psych: well oriented with normal range of affect and appropriate judgment/insight  Assessment and Plan  Well adult exam - Plan: CBC, Comprehensive metabolic panel, Lipid panel, Magnesium  Need for influenza vaccination - Plan: Flu Vaccine QUAD 6+ mos PF IM (Fluarix Quad PF)  Screening for HIV (human immunodeficiency virus) - Plan: HIV Antibody (routine testing w rflx)  Need for tetanus booster - Plan: Tdap vaccine greater than or equal to 7yo  IM   Well 37 y.o. male. Counseled on diet and exercise. Update imms. Stretch, consider pickle juice, ck lytes, stay hydrated. Other orders as above. Follow up in 1 year pending the above workup. The patient voiced understanding and agreement to the  plan.  Jilda Roche Hersey, DO 07/29/18 4:25 PM

## 2018-07-29 NOTE — Patient Instructions (Addendum)
Give [place] 2-3 business days to get the results of your labs back.   Heat (pad or rice pillow in microwave) over affected area, 10-15 minutes twice daily.   Stretch the area 3 times weekly. Consider pickle juice.   Do monthly self testicular checks in the shower. You are feeling for lumps/bumps that don't belong. If you feel anything like this, let me know!  Let us know if you need anything.

## 2018-07-30 LAB — COMPREHENSIVE METABOLIC PANEL
ALT: 26 U/L (ref 0–53)
AST: 19 U/L (ref 0–37)
Albumin: 4.7 g/dL (ref 3.5–5.2)
Alkaline Phosphatase: 45 U/L (ref 39–117)
BUN: 12 mg/dL (ref 6–23)
CO2: 28 mEq/L (ref 19–32)
Calcium: 9.4 mg/dL (ref 8.4–10.5)
Chloride: 101 mEq/L (ref 96–112)
Creatinine, Ser: 0.89 mg/dL (ref 0.40–1.50)
GFR: 100.77 mL/min (ref >60.00)
Glucose, Bld: 94 mg/dL (ref 70–99)
Potassium: 4 mEq/L (ref 3.5–5.1)
Sodium: 138 mEq/L (ref 135–145)
Total Bilirubin: 1.1 mg/dL (ref 0.2–1.2)
Total Protein: 7.3 g/dL (ref 6.0–8.3)

## 2018-07-30 LAB — LIPID PANEL
Cholesterol: 203 mg/dL — ABNORMAL HIGH (ref 0–200)
HDL: 49.9 mg/dL (ref >39.00)
LDL Cholesterol: 134 mg/dL — ABNORMAL HIGH (ref 0–99)
NonHDL: 153
Total CHOL/HDL Ratio: 4
Triglycerides: 97 mg/dL (ref 0.0–149.0)
VLDL: 19.4 mg/dL (ref 0.0–40.0)

## 2018-07-30 LAB — HIV ANTIBODY (ROUTINE TESTING W REFLEX): HIV 1&2 Ab, 4th Generation: NONREACTIVE

## 2018-07-30 LAB — CBC
HCT: 47.4 % (ref 39.0–52.0)
Hemoglobin: 16.2 g/dL (ref 13.0–17.0)
MCHC: 34.1 g/dL (ref 30.0–36.0)
MCV: 91.5 fl (ref 78.0–100.0)
Platelets: 220 10*3/uL (ref 150.0–400.0)
RBC: 5.18 Mil/uL (ref 4.22–5.81)
RDW: 12.5 % (ref 11.5–15.5)
WBC: 7.4 10*3/uL (ref 4.0–10.5)

## 2018-07-30 LAB — MAGNESIUM: Magnesium: 2.1 mg/dL (ref 1.5–2.5)

## 2018-09-02 ENCOUNTER — Encounter: Payer: Self-pay | Admitting: Podiatry

## 2018-09-02 ENCOUNTER — Ambulatory Visit: Payer: BLUE CROSS/BLUE SHIELD | Admitting: Podiatry

## 2018-09-02 DIAGNOSIS — L600 Ingrowing nail: Secondary | ICD-10-CM | POA: Diagnosis not present

## 2018-09-02 NOTE — Patient Instructions (Signed)

## 2018-09-04 DIAGNOSIS — L600 Ingrowing nail: Secondary | ICD-10-CM | POA: Insufficient documentation

## 2018-09-04 NOTE — Progress Notes (Signed)
Subjective: 37 year old male presents the office today for concerns of ingrown toenail to the right big toe, lateral aspect which is been ongoing for some time is been getting worse since last week.  He did have some drainage coming from the area denies any red streaks or any other issues.  Is painful to shoes.  No other concerns. Denies any systemic complaints such as fevers, chills, nausea, vomiting. No acute changes since last appointment, and no other complaints at this time.   Objective: AAO x3, NAD DP/PT pulses palpable bilaterally, CRT less than 3 seconds Incurvation present to the lateral aspect the right hallux toenail with tenderness palpation.  Localized edema and erythema but this is more from inflammation as opposed to infection.  No ascending cellulitis.  Is no fluctuation or crepitation or any malodor.  No clinical signs of infection noted today otherwise. No open lesions or pre-ulcerative lesions.  No pain with calf compression, swelling, warmth, erythema  Assessment: Right hallux lateral border ingrown toenail  Plan: -All treatment options discussed with the patient including all alternatives, risks, complications.  -Etiology of symptoms were discussed -At this time, the patient is requesting partial nail removal with chemical matricectomy to the symptomatic portion of the nail. Risks and complications were discussed with the patient for which they understand and written consent was obtained. Under sterile conditions a total of 3 mL of a mixture of 2% lidocaine plain and 0.5% Marcaine plain was infiltrated in a hallux block fashion. Once anesthetized, the skin was prepped in sterile fashion. A tourniquet was then applied. Next the lateral aspect of hallux nail border was then sharply excised making sure to remove the entire offending nail border. Once the nails were ensured to be removed area was debrided and the underlying skin was intact. There is no purulence identified in the  procedure. Next phenol was then applied under standard conditions and copiously irrigated. Silvadene was applied. A dry sterile dressing was applied. After application of the dressing the tourniquet was removed and there is found to be an immediate capillary refill time to the digit. The patient tolerated the procedure well any complications. Post procedure instructions were discussed the patient for which he verbally understood. Follow-up in one week for nail check or sooner if any problems are to arise. Discussed signs/symptoms of infection and directed to call the office immediately should any occur or go directly to the emergency room. In the meantime, encouraged to call the office with any questions, concerns, changes symptoms. -Patient encouraged to call the office with any questions, concerns, change in symptoms.   Vivi BarrackMatthew R Arrion Broaddus DPM

## 2018-09-10 ENCOUNTER — Ambulatory Visit (INDEPENDENT_AMBULATORY_CARE_PROVIDER_SITE_OTHER): Payer: BLUE CROSS/BLUE SHIELD | Admitting: Podiatry

## 2018-09-10 DIAGNOSIS — L600 Ingrowing nail: Secondary | ICD-10-CM

## 2018-09-10 NOTE — Patient Instructions (Signed)

## 2018-09-11 NOTE — Progress Notes (Signed)
Patient is here today for follow-up appointment, recent procedure performed on 09/02/2018, removal of right hallux lateral border ingrown toenail.  He states that overall the areas healing well and is not having any pain at this time.  No redness, no no erythema, no swelling, no drainage, no other signs of infection.  Area is healing well and scabbed over at this time.  Discussed signs and symptoms of infection.  Verbal and written instructions were given to the patient.  He is to follow-up as needed with any acute symptom changes.

## 2018-09-18 ENCOUNTER — Encounter: Payer: Self-pay | Admitting: Podiatry

## 2018-09-18 ENCOUNTER — Ambulatory Visit: Payer: BLUE CROSS/BLUE SHIELD | Admitting: Podiatry

## 2018-09-18 DIAGNOSIS — L03031 Cellulitis of right toe: Secondary | ICD-10-CM | POA: Diagnosis not present

## 2018-09-18 MED ORDER — CEPHALEXIN 500 MG PO CAPS: 500.0000 mg | ORAL_CAPSULE | Freq: Two times a day (BID) | ORAL | 0 refills | Status: DC

## 2018-09-18 NOTE — Progress Notes (Signed)
Subjective:   Patient ID: Maurice Roberts, male   DOB: 37 y.o.   MRN: [id number]   HPI Patient admits that he did stop soaking his big toe and that he developed some redness and there is some crusted tissue in the border and it is mildly sore and is not noted active drainage   ROS      Objective:  Physical Exam  Neurovascular status intact with patient's right hallux lateral side showing some localized redness with crusted tissue formation with no proximal edema erythema drainage noted     Assessment:  Paronychia infection right hallux lateral border with patient having stopped soaking probably earlier than he should have     Plan:  Today using sterile instrumentation I debrided out the border and I was able to remove the crusted tissue to allow drainage and I placed him on cephalexin 500 mg twice daily for a week along with continued soaks.  If redness should persist or get worse he is to be seen back immediately

## 2018-10-09 ENCOUNTER — Telehealth: Payer: Self-pay | Admitting: Podiatry

## 2018-10-09 MED ORDER — CEPHALEXIN 500 MG PO CAPS: 500.0000 mg | ORAL_CAPSULE | Freq: Two times a day (BID) | ORAL | 0 refills | Status: DC

## 2018-10-09 NOTE — Addendum Note (Signed)
Addended by: Alphia Kava'CONNELL, Kynedi Profitt D on: 10/09/2018 02:03 PM   Modules accepted: Orders

## 2018-10-09 NOTE — Telephone Encounter (Signed)
Left message informing pt of Dr. Beverlee Nimsegal's refill of the antibiotic.

## 2018-10-09 NOTE — Telephone Encounter (Signed)
Pt called to see if he could get a refill on medication for toe infection to hold him over until his appt. on 12/20. Please give pt a call.

## 2018-10-11 ENCOUNTER — Encounter: Payer: Self-pay | Admitting: Podiatry

## 2018-10-11 ENCOUNTER — Ambulatory Visit (INDEPENDENT_AMBULATORY_CARE_PROVIDER_SITE_OTHER): Payer: BLUE CROSS/BLUE SHIELD | Admitting: Podiatry

## 2018-10-11 DIAGNOSIS — L600 Ingrowing nail: Secondary | ICD-10-CM | POA: Diagnosis not present

## 2018-10-11 NOTE — Patient Instructions (Signed)

## 2018-10-13 NOTE — Progress Notes (Signed)
Subjective: 37 year old male presents the office today for concerns of recurrent ingrown toenail to the right hallux, lateral aspect.  He states that he was doing well for some time however it started to get infected again he started antibiotics yesterday.  He noticed some increasing redness and some swelling to the area but no drainage or pus.  No recent injury. Denies any systemic complaints such as fevers, chills, nausea, vomiting. No acute changes since last appointment, and no other complaints at this time.   Objective: AAO x3, NAD DP/PT pulses palpable bilaterally, CRT less than 3 seconds There is localized edema and erythema present to the lateral aspect of the right hallux toenail but there is no drainage or pus identified today and there is no fluctuation or crepitation.  No ascending cellulitis.  Tenderness palpation of the lateral corner.  No open lesions or pre-ulcerative lesions.  No pain with calf compression, swelling, warmth, erythema  Assessment: Recurrent right lateral hallux ingrown toenail  Plan: -All treatment options discussed with the patient including all alternatives, risks, complications.  -At this time, the patient is requesting partial nail removal with chemical matricectomy to the symptomatic portion of the nail. Risks and complications were discussed with the patient for which they understand and written consent was obtained. Under sterile conditions a total of 3 mL of a mixture of 2% lidocaine plain and 0.5% Marcaine plain was infiltrated in a hallux block fashion. Once anesthetized, the skin was prepped in sterile fashion. A tourniquet was then applied. Next the lateral aspect of hallux nail border was then sharply excised making sure to remove the entire offending nail border. Once the nails were ensured to be removed area was debrided and the underlying skin was intact. There is no purulence identified in the procedure. Next phenol was then applied under standard  conditions and copiously irrigated. Silvadene was applied. A dry sterile dressing was applied. After application of the dressing the tourniquet was removed and there is found to be an immediate capillary refill time to the digit. The patient tolerated the procedure well any complications. Post procedure instructions were discussed the patient for which he verbally understood. Follow-up in one week for nail check or sooner if any problems are to arise. Discussed signs/symptoms of infection and directed to call the office immediately should any occur or go directly to the emergency room. In the meantime, encouraged to call the office with any questions, concerns, changes symptoms. -Finish course of antibiotics. -Patient encouraged to call the office with any questions, concerns, change in symptoms.   Maurice Roberts DPM

## 2018-10-18 ENCOUNTER — Ambulatory Visit: Payer: BLUE CROSS/BLUE SHIELD | Admitting: Podiatry

## 2019-06-20 ENCOUNTER — Telehealth: Payer: Self-pay | Admitting: Family Medicine

## 2019-06-20 NOTE — Telephone Encounter (Signed)
LVM for pt to reschedule 10-08, provider not in office this day and needing to reschedule.

## 2019-07-31 ENCOUNTER — Encounter: Payer: Self-pay | Admitting: Family Medicine

## 2019-08-08 ENCOUNTER — Encounter: Payer: Self-pay | Admitting: Family Medicine

## 2019-08-08 ENCOUNTER — Ambulatory Visit (INDEPENDENT_AMBULATORY_CARE_PROVIDER_SITE_OTHER): Payer: BLUE CROSS/BLUE SHIELD | Admitting: Family Medicine

## 2019-08-08 ENCOUNTER — Other Ambulatory Visit: Payer: Self-pay

## 2019-08-08 VITALS — BP 110/80 | HR 75 | Temp 97.0°F | Ht 68.5 in | Wt 224.2 lb

## 2019-08-08 DIAGNOSIS — Z23 Encounter for immunization: Secondary | ICD-10-CM | POA: Diagnosis not present

## 2019-08-08 DIAGNOSIS — Z0000 Encounter for general adult medical examination without abnormal findings: Secondary | ICD-10-CM

## 2019-08-08 DIAGNOSIS — Z125 Encounter for screening for malignant neoplasm of prostate: Secondary | ICD-10-CM

## 2019-08-08 LAB — COMPREHENSIVE METABOLIC PANEL
ALT: 35 U/L (ref 0–53)
AST: 22 U/L (ref 0–37)
Albumin: 4.6 g/dL (ref 3.5–5.2)
Alkaline Phosphatase: 46 U/L (ref 39–117)
BUN: 13 mg/dL (ref 6–23)
CO2: 29 mEq/L (ref 19–32)
Calcium: 9.2 mg/dL (ref 8.4–10.5)
Chloride: 103 mEq/L (ref 96–112)
Creatinine, Ser: 0.9 mg/dL (ref 0.40–1.50)
GFR: 93.11 mL/min (ref >60.00)
Glucose, Bld: 91 mg/dL (ref 70–99)
Potassium: 4.2 mEq/L (ref 3.5–5.1)
Sodium: 139 mEq/L (ref 135–145)
Total Bilirubin: 0.9 mg/dL (ref 0.2–1.2)
Total Protein: 6.9 g/dL (ref 6.0–8.3)

## 2019-08-08 LAB — LIPID PANEL
Cholesterol: 203 mg/dL — ABNORMAL HIGH (ref 0–200)
HDL: 42.3 mg/dL (ref >39.00)
LDL Cholesterol: 141 mg/dL — ABNORMAL HIGH (ref 0–99)
NonHDL: 160.92
Total CHOL/HDL Ratio: 5
Triglycerides: 100 mg/dL (ref 0.0–149.0)
VLDL: 20 mg/dL (ref 0.0–40.0)

## 2019-08-08 LAB — CBC
HCT: 48.8 % (ref 39.0–52.0)
Hemoglobin: 16.5 g/dL (ref 13.0–17.0)
MCHC: 33.7 g/dL (ref 30.0–36.0)
MCV: 92.3 fl (ref 78.0–100.0)
Platelets: 219 10*3/uL (ref 150.0–400.0)
RBC: 5.28 Mil/uL (ref 4.22–5.81)
RDW: 12.4 % (ref 11.5–15.5)
WBC: 6.4 10*3/uL (ref 4.0–10.5)

## 2019-08-08 LAB — PSA: PSA: 0.41 ng/mL (ref 0.10–4.00)

## 2019-08-08 NOTE — Patient Instructions (Signed)
Give us 2-3 business days to get the results of your labs back.   Keep the diet clean and stay active.  Let us know if you need anything. 

## 2019-08-08 NOTE — Progress Notes (Signed)
Chief Complaint  Patient presents with  . Annual Exam    Well Male Maurice Roberts is here for a complete physical.   His last physical was >1 year ago.  Current diet: in general, diet is fair.   Current exercise: cycling, wt resistance at gym Weight trend: stable Daytime fatigue? No. Seat belt? Yes.    Health maintenance Tetanus- Yes HIV- Yes  Past Medical History:  Diagnosis Date  . History of chicken pox   . Hyperhidrosis   . Tinea versicolor      Past Surgical History:  Procedure Laterality Date  . KNEE SURGERY  1993    Medications  Takes no meds routinely.   Allergies Allergies  Allergen Reactions  . Penicillins     Family History Family History  Problem Relation Age of Onset  . Cancer Maternal Grandmother        Breast     Review of Systems: Constitutional: no fevers or chills Eye:  no recent significant change in vision Ear/Nose/Mouth/Throat:  Ears:  no hearing loss Nose/Mouth/Throat:  no complaints of nasal congestion, no sore throat Cardiovascular:  no chest pain Respiratory:  no shortness of breath Gastrointestinal:  no abdominal pain, no change in bowel habits GU:  Male: negative for dysuria, frequency, and incontinence Musculoskeletal/Extremities:  no pain of the joints Integumentary (Skin/Breast):  no abnormal skin lesions reported Neurologic:  no headaches Endocrine: No unexpected weight changes Hematologic/Lymphatic:  no night sweats  Exam BP 110/80 (BP Location: Left Arm, Patient Position: Sitting, Cuff Size: Normal)   Pulse 75   Temp (!) 97 F (36.1 C) (Temporal)   Ht 5' 8.5" (1.74 m)   Wt 224 lb 4 oz (101.7 kg)   SpO2 98%   BMI 33.60 kg/m  General:  well developed, well nourished, in no apparent distress Skin:  no significant moles, warts, or growths Head:  no masses, lesions, or tenderness Eyes:  pupils equal and round, sclera anicteric without injection Ears:  canals without lesions, TMs shiny without retraction, no obvious  effusion, no erythema Nose:  nares patent, septum midline, mucosa normal Throat/Pharynx:  lips and gingiva without lesion; tongue and uvula midline; non-inflamed pharynx; no exudates or postnasal drainage Neck: neck supple without adenopathy, thyromegaly, or masses Lungs:  clear to auscultation, breath sounds equal bilaterally, no respiratory distress Cardio:  regular rate and rhythm, no bruits, no LE edema Abdomen:  abdomen soft, nontender; bowel sounds normal; no masses or organomegaly Rectal: Deferred Musculoskeletal:  symmetrical muscle groups noted without atrophy or deformity Extremities:  no clubbing, cyanosis, or edema, no deformities, no skin discoloration Neuro:  gait normal; deep tendon reflexes normal and symmetric Psych: well oriented with normal range of affect and appropriate judgment/insight  Assessment and Plan  Well adult exam - Plan: CBC, Comprehensive metabolic panel, Lipid panel  Need for influenza vaccination - Plan: Flu Vaccine QUAD 6+ mos PF IM (Fluarix Quad PF)  Screening for prostate cancer - Plan: PSA   Well 38 y.o. male. Counseled on diet and exercise. Counseled on risks and benefits of prostate cancer screening with PSA. The patient agrees to undergo screening.  Other orders as above. Follow up in 1 year pending the above workup. The patient voiced understanding and agreement to the plan.  [place], DO 08/08/19 9:19 AM

## 2019-08-11 ENCOUNTER — Other Ambulatory Visit: Payer: Self-pay | Admitting: Family Medicine

## 2019-08-11 DIAGNOSIS — E785 Hyperlipidemia, unspecified: Secondary | ICD-10-CM

## 2019-09-01 ENCOUNTER — Telehealth: Payer: Self-pay

## 2019-09-01 ENCOUNTER — Other Ambulatory Visit: Payer: Self-pay | Admitting: Family Medicine

## 2019-09-01 DIAGNOSIS — Z129 Encounter for screening for malignant neoplasm, site unspecified: Secondary | ICD-10-CM

## 2019-09-01 NOTE — Telephone Encounter (Signed)
Copied from [place] [id number]. Topic: General - Other >> Sep 01, 2019  7:07 AM Carolyn Stare wrote: Pt said when he had his CPE in Oct he was told if he wanted to see a dermatologist to call back and let Dr Nani Ravens know, he would like that referral for some moles and he has a history of skin cancer

## 2019-09-01 NOTE — Telephone Encounter (Signed)
OK to refer.

## 2019-09-01 NOTE — Telephone Encounter (Signed)
done

## 2020-02-09 ENCOUNTER — Other Ambulatory Visit (INDEPENDENT_AMBULATORY_CARE_PROVIDER_SITE_OTHER): Payer: BLUE CROSS/BLUE SHIELD

## 2020-02-09 ENCOUNTER — Other Ambulatory Visit: Payer: Self-pay

## 2020-02-09 DIAGNOSIS — E785 Hyperlipidemia, unspecified: Secondary | ICD-10-CM | POA: Diagnosis not present

## 2020-02-09 LAB — LIPID PANEL
Cholesterol: 150 mg/dL (ref 0–200)
HDL: 42.5 mg/dL (ref >39.00)
LDL Cholesterol: 99 mg/dL (ref 0–99)
NonHDL: 107.85
Total CHOL/HDL Ratio: 4
Triglycerides: 45 mg/dL (ref 0.0–149.0)
VLDL: 9 mg/dL (ref 0.0–40.0)

## 2020-04-02 ENCOUNTER — Other Ambulatory Visit: Payer: Self-pay

## 2020-04-02 ENCOUNTER — Ambulatory Visit (INDEPENDENT_AMBULATORY_CARE_PROVIDER_SITE_OTHER): Payer: BLUE CROSS/BLUE SHIELD | Admitting: Podiatry

## 2020-04-02 DIAGNOSIS — M7661 Achilles tendinitis, right leg: Secondary | ICD-10-CM

## 2020-04-02 DIAGNOSIS — M722 Plantar fascial fibromatosis: Secondary | ICD-10-CM | POA: Diagnosis not present

## 2020-04-02 DIAGNOSIS — M7662 Achilles tendinitis, left leg: Secondary | ICD-10-CM | POA: Diagnosis not present

## 2020-04-06 NOTE — Progress Notes (Signed)
Subjective: 39 year old male presents the office today requesting orthotics.  He states he is not having any significant discomfort or any issues with his feet he needs new orthotics.  He states he does not wear the orthotics to get discomfort.  We previously done these in 2018 he was having Achilles issues/plantar fasciitis. Denies any systemic complaints such as fevers, chills, nausea, vomiting. No acute changes since last appointment, and no other complaints at this time.   Objective: AAO x3, NAD DP/PT pulses palpable bilaterally, CRT less than 3 seconds At this time there is no significant discomfort to bilateral lower extremities.  There is no pain currently on the Achilles tendon or the plantar fascia.  Ankle, subtalar range of motion intact.  There is no edema, erythema. No pain with calf compression, swelling, warmth, erythema  Assessment: History of Achilles tendinitis  Plan: -All treatment options discussed with the patient including all alternatives, risks, complications.  -Currently asymptomatic requesting new orthotics.  He was seen today by Fenton Foy and he was measured for orthotics.  General discussed stretching, icing as well as supportive shoes. -Patient encouraged to call the office with any questions, concerns, change in symptoms.   Return in about 4 weeks (around 04/30/2020).  To pick up orthotics.  I will see him back as needed  Vivi Barrack DPM

## 2020-08-10 ENCOUNTER — Other Ambulatory Visit: Payer: Self-pay

## 2020-08-10 ENCOUNTER — Ambulatory Visit (INDEPENDENT_AMBULATORY_CARE_PROVIDER_SITE_OTHER): Payer: BLUE CROSS/BLUE SHIELD | Admitting: Family Medicine

## 2020-08-10 ENCOUNTER — Encounter: Payer: Self-pay | Admitting: Family Medicine

## 2020-08-10 VITALS — BP 118/90 | HR 54 | Temp 98.1°F | Ht 68.0 in | Wt 215.4 lb

## 2020-08-10 DIAGNOSIS — Z125 Encounter for screening for malignant neoplasm of prostate: Secondary | ICD-10-CM

## 2020-08-10 DIAGNOSIS — Z1159 Encounter for screening for other viral diseases: Secondary | ICD-10-CM

## 2020-08-10 DIAGNOSIS — Z0000 Encounter for general adult medical examination without abnormal findings: Secondary | ICD-10-CM | POA: Diagnosis not present

## 2020-08-10 DIAGNOSIS — Z23 Encounter for immunization: Secondary | ICD-10-CM

## 2020-08-10 NOTE — Patient Instructions (Signed)
Give us 2-3 business days to get the results of your labs back.   Keep the diet clean and stay active.  Aim to do some physical exertion for 150 minutes per week. This is typically divided into 5 days per week, 30 minutes per day. The activity should be enough to get your heart rate up. Anything is better than nothing if you have time constraints.  Please consider counseling. Contact [phone] to schedule an appointment or inquire about cost/insurance coverage.  Let us know if you need anything. 

## 2020-08-10 NOTE — Progress Notes (Signed)
Chief Complaint  Patient presents with  . Annual Exam  . Anxiety    Well Male Maurice Roberts is here for a complete physical.   His last physical was >1 year ago.  Current diet: in general, an "OK" diet.   Current exercise: cardio, some lifting Weight trend: has intentionally lost Fatigue out of ordinary? No. Seat belt? Yes.    Health maintenance Tetanus- Yes HIV- Yes Hep C- No  Past Medical History:  Diagnosis Date  . History of chicken pox   . Hyperhidrosis   . Tinea versicolor      Past Surgical History:  Procedure Laterality Date  . KNEE SURGERY  1993    Medications  Takes no meds routinely.    Allergies Allergies  Allergen Reactions  . Penicillins     Family History Family History  Problem Relation Age of Onset  . Cancer Maternal Grandmother        Breast     Review of Systems: Constitutional: no fevers or chills Eye:  no recent significant change in vision Ear/Nose/Mouth/Throat:  Ears:  no hearing loss Nose/Mouth/Throat:  no complaints of nasal congestion, no sore throat Cardiovascular:  no chest pain Respiratory:  no shortness of breath Gastrointestinal:  no abdominal pain, no change in bowel habits GU:  Male: negative for dysuria, frequency, and incontinence Musculoskeletal/Extremities:  no new pain of the joints Integumentary (Skin/Breast):  no abnormal skin lesions reported Neurologic:  no headaches Endocrine: No unexpected weight changes Hematologic/Lymphatic:  no night sweats  Exam BP 118/90 (BP Location: Left Arm, Patient Position: Sitting, Cuff Size: Normal)   Pulse (!) 54   Temp 98.1 F (36.7 C) (Oral)   Ht 5\' 8"  (1.727 m)   Wt 215 lb 6 oz (97.7 kg)   SpO2 97%   BMI 32.75 kg/m  General:  well developed, well nourished, in no apparent distress Skin:  no significant moles, warts, or growths Head:  no masses, lesions, or tenderness Eyes:  pupils equal and round, sclera anicteric without injection Ears:  canals without lesions, TMs  shiny without retraction, no obvious effusion, no erythema Nose:  nares patent, septum midline, mucosa normal Throat/Pharynx:  lips and gingiva without lesion; tongue and uvula midline; non-inflamed pharynx; no exudates or postnasal drainage Neck: neck supple without adenopathy, thyromegaly, or masses Lungs:  clear to auscultation, breath sounds equal bilaterally, no respiratory distress Cardio:  regular rate and rhythm, no bruits, no LE edema Abdomen:  abdomen soft, nontender; bowel sounds normal; no masses or organomegaly Rectal: Deferred Musculoskeletal:  symmetrical muscle groups noted without atrophy or deformity Extremities:  no clubbing, cyanosis, or edema, no deformities, no skin discoloration Neuro:  gait normal; deep tendon reflexes normal and symmetric Psych: well oriented with normal range of affect and appropriate judgment/insight  Assessment and Plan  Need for influenza vaccination - Plan: Flu Vaccine QUAD 6+ mos PF IM (Fluarix Quad PF)   Well 38 y.o. male. Counseled on diet and exercise. Counseled on risks and benefits of prostate cancer screening with PSA. The patient agrees to undergo screening.  Other orders as above. Follow up in 1 yr pending the above workup. The patient voiced understanding and agreement to the plan.  [address] [place], DO 08/10/20 8:09 AM

## 2020-08-11 LAB — COMPREHENSIVE METABOLIC PANEL
AG Ratio: 1.7 (calc) (ref 1.0–2.5)
ALT: 27 U/L (ref 9–46)
AST: 22 U/L (ref 10–40)
Albumin: 4.4 g/dL (ref 3.6–5.1)
Alkaline phosphatase (APISO): 43 U/L (ref 36–130)
BUN: 12 mg/dL (ref 7–25)
CO2: 25 mmol/L (ref 20–32)
Calcium: 9.2 mg/dL (ref 8.6–10.3)
Chloride: 104 mmol/L (ref 98–110)
Creat: 0.86 mg/dL (ref 0.60–1.35)
Globulin: 2.6 g/dL (calc) (ref 1.9–3.7)
Glucose, Bld: 95 mg/dL (ref 65–99)
Potassium: 4.3 mmol/L (ref 3.5–5.3)
Sodium: 139 mmol/L (ref 135–146)
Total Bilirubin: 0.7 mg/dL (ref 0.2–1.2)
Total Protein: 7 g/dL (ref 6.1–8.1)

## 2020-08-11 LAB — HEPATITIS C ANTIBODY
Hepatitis C Ab: NONREACTIVE
SIGNAL TO CUT-OFF: 0.02 (ref <1.00)

## 2020-08-11 LAB — LIPID PANEL
Cholesterol: 194 mg/dL (ref <200)
HDL: 48 mg/dL (ref >=40)
LDL Cholesterol (Calc): 127 mg/dL (calc) — ABNORMAL HIGH
Non-HDL Cholesterol (Calc): 146 mg/dL (calc) — ABNORMAL HIGH (ref <130)
Total CHOL/HDL Ratio: 4 (calc) (ref <5.0)
Triglycerides: 89 mg/dL (ref <150)

## 2020-08-11 LAB — PSA: PSA: 0.33 ng/mL (ref <=4.0)

## 2021-07-04 ENCOUNTER — Ambulatory Visit (INDEPENDENT_AMBULATORY_CARE_PROVIDER_SITE_OTHER): Payer: No Typology Code available for payment source | Admitting: Family Medicine

## 2021-07-04 ENCOUNTER — Encounter: Payer: Self-pay | Admitting: Family Medicine

## 2021-07-04 ENCOUNTER — Other Ambulatory Visit: Payer: Self-pay

## 2021-07-04 VITALS — BP 120/68 | HR 66 | Temp 98.3°F | Ht 68.0 in | Wt 222.2 lb

## 2021-07-04 DIAGNOSIS — Z0000 Encounter for general adult medical examination without abnormal findings: Secondary | ICD-10-CM | POA: Diagnosis not present

## 2021-07-04 DIAGNOSIS — R1011 Right upper quadrant pain: Secondary | ICD-10-CM | POA: Diagnosis not present

## 2021-07-04 DIAGNOSIS — Z125 Encounter for screening for malignant neoplasm of prostate: Secondary | ICD-10-CM | POA: Diagnosis not present

## 2021-07-04 LAB — CBC
HCT: 45.2 % (ref 39.0–52.0)
Hemoglobin: 15.3 g/dL (ref 13.0–17.0)
MCHC: 33.9 g/dL (ref 30.0–36.0)
MCV: 92.1 fl (ref 78.0–100.0)
Platelets: 213 10*3/uL (ref 150.0–400.0)
RBC: 4.91 Mil/uL (ref 4.22–5.81)
RDW: 12.1 % (ref 11.5–15.5)
WBC: 5.7 10*3/uL (ref 4.0–10.5)

## 2021-07-04 LAB — COMPREHENSIVE METABOLIC PANEL
ALT: 32 U/L (ref 0–53)
AST: 23 U/L (ref 0–37)
Albumin: 4.4 g/dL (ref 3.5–5.2)
Alkaline Phosphatase: 43 U/L (ref 39–117)
BUN: 13 mg/dL (ref 6–23)
CO2: 26 mEq/L (ref 19–32)
Calcium: 9.1 mg/dL (ref 8.4–10.5)
Chloride: 103 mEq/L (ref 96–112)
Creatinine, Ser: 0.83 mg/dL (ref 0.40–1.50)
GFR: 107.85 mL/min (ref >60.00)
Glucose, Bld: 86 mg/dL (ref 70–99)
Potassium: 4.1 mEq/L (ref 3.5–5.1)
Sodium: 139 mEq/L (ref 135–145)
Total Bilirubin: 1.1 mg/dL (ref 0.2–1.2)
Total Protein: 6.9 g/dL (ref 6.0–8.3)

## 2021-07-04 LAB — LIPID PANEL
Cholesterol: 197 mg/dL (ref 0–200)
HDL: 41.8 mg/dL (ref >39.00)
LDL Cholesterol: 141 mg/dL — ABNORMAL HIGH (ref 0–99)
NonHDL: 154.76
Total CHOL/HDL Ratio: 5
Triglycerides: 68 mg/dL (ref 0.0–149.0)
VLDL: 13.6 mg/dL (ref 0.0–40.0)

## 2021-07-04 LAB — PSA: PSA: 0.42 ng/mL (ref 0.10–4.00)

## 2021-07-04 NOTE — Progress Notes (Signed)
Chief Complaint  Patient presents with   Follow-up    Upper abdominal pain     Maurice Roberts is here for abdominal pain.  Duration: several years, worsening over the past several weeks Pain is crampy/achy.  Nighttime awakenings? No Bleeding? No Weight loss? No Palliation: Stretching Provocation: laying on it, bending over Associated symptoms:  none Denies: fever, nausea, vomiting, and bowel changes/bleeding, shoulder pain Treatment to date: none Not a/w meals always. Concerned it could be related to fatty liver disease, diet has been poor.   Past Medical History:  Diagnosis Date   History of chicken pox    Hyperhidrosis    Tinea versicolor     BP 120/68   Pulse 66   Temp 98.3 F (36.8 C) (Oral)   Ht 5\' 8"  (1.727 m)   Wt 222 lb 4 oz (100.8 kg)   SpO2 99%   BMI 33.79 kg/m  Gen.: Awake, alert, appears stated age HEENT: Mucous membranes moist without mucosal lesions Heart: Regular rate and rhythm without murmurs Lungs: Clear auscultation bilaterally, no rales or wheezing, normal effort without accessory muscle use. Abdomen: Bowel sounds are present. Abdomen is soft, nontender, nondistended, no masses or organomegaly. Negative Murphy's, Rovsing's, McBurney's, and Carnett's sign. Psych: Age appropriate judgment and insight. Normal mood and affect.  Right upper quadrant abdominal pain - Plan: ABDOMEN LIMITED RUQ (LIVER/GB)  Well adult exam - Plan: CBC, Comprehensive metabolic panel, Lipid panel  Screening for prostate cancer - Plan: PSA  New problem, uncertain prog. Ck labs for CPE today as he has fasted. Ck RUQ abd [place]. Stay hydrated. Consider Mg and/or pickle juice/mustard.  F/u as originally scheduled Pt voiced understanding and agreement to the plan.  [place], DO 07/04/21 10:04 AM

## 2021-07-04 NOTE — Patient Instructions (Addendum)
Give [place] 2-3 business days to get the results of your labs back.   Someone will reach out in the next couple days regarding your ultrasound.   Aim to do some physical exertion for 150 minutes per week. This is typically divided into 5 days per week, 30 minutes per day. The activity should be enough to get your heart rate up. Anything is better than nothing if you have time constraints.  Stay hydrated. Consider 200-400 mg of magnesium daily or a spoonful of pickle juice or mustard daily.   Let us know if you need anything.

## 2021-07-05 ENCOUNTER — Ambulatory Visit (HOSPITAL_BASED_OUTPATIENT_CLINIC_OR_DEPARTMENT_OTHER)
Admission: RE | Admit: 2021-07-05 | Discharge: 2021-07-05 | Disposition: A | Discharge status: Home / Self Care | Payer: No Typology Code available for payment source | Source: Ambulatory Visit | Attending: Family Medicine | Admitting: Family Medicine

## 2021-07-05 DIAGNOSIS — R1011 Right upper quadrant pain: Secondary | ICD-10-CM | POA: Diagnosis not present

## 2021-08-11 ENCOUNTER — Other Ambulatory Visit: Payer: Self-pay

## 2021-08-12 ENCOUNTER — Ambulatory Visit (INDEPENDENT_AMBULATORY_CARE_PROVIDER_SITE_OTHER): Payer: No Typology Code available for payment source | Admitting: Family Medicine

## 2021-08-12 ENCOUNTER — Encounter: Payer: Self-pay | Admitting: Family Medicine

## 2021-08-12 VITALS — BP 110/78 | HR 62 | Temp 97.8°F | Ht 68.0 in | Wt 212.4 lb

## 2021-08-12 DIAGNOSIS — Z23 Encounter for immunization: Secondary | ICD-10-CM

## 2021-08-12 DIAGNOSIS — Z0000 Encounter for general adult medical examination without abnormal findings: Secondary | ICD-10-CM | POA: Diagnosis not present

## 2021-08-12 LAB — LIPID PANEL
Cholesterol: 175 mg/dL (ref 0–200)
HDL: 47 mg/dL (ref >39.00)
LDL Cholesterol: 114 mg/dL — ABNORMAL HIGH (ref 0–99)
NonHDL: 127.9
Total CHOL/HDL Ratio: 4
Triglycerides: 70 mg/dL (ref 0.0–149.0)
VLDL: 14 mg/dL (ref 0.0–40.0)

## 2021-08-12 NOTE — Progress Notes (Signed)
Chief Complaint  Patient presents with   Annual Exam    CPE- Fasting      Well Male Maurice Roberts is here for a complete physical.   His last physical was >1 year ago.  Current diet: in general, a "healthy" diet.   Current exercise: lifting, some cardio Weight trend: increasingly  Fatigue out of ordinary? No. Seat belt? Yes.    Health maintenance Tetanus- Yes HIV- Yes Hep C- Yes  Past Medical History:  Diagnosis Date   History of chicken pox    Hyperhidrosis    Tinea versicolor      Past Surgical History:  Procedure Laterality Date   KNEE SURGERY  1993    Medications  Current Outpatient Medications on File Prior to Visit  Medication Sig Dispense Refill   ketoconazole (NIZORAL) 2 % shampoo Apply 1 application topically 3 (three) times a week.     Allergies Allergies  Allergen Reactions   Penicillins    Family History Family History  Problem Relation Age of Onset   Cancer Maternal Grandmother        Breast     Review of Systems: Constitutional: no fevers or chills Eye:  no recent significant change in vision Ear/Nose/Mouth/Throat:  Ears:  no hearing loss Nose/Mouth/Throat:  no complaints of nasal congestion, no sore throat Cardiovascular:  no chest pain Respiratory:  no shortness of breath Gastrointestinal:  no abdominal pain, no change in bowel habits GU:  Male: negative for dysuria, frequency, and incontinence Musculoskeletal/Extremities:  no pain of the joints Integumentary (Skin/Breast):  no abnormal skin lesions reported Neurologic:  no headaches Endocrine: No unexpected weight changes Hematologic/Lymphatic:  no night sweats  Exam BP 110/78   Pulse 62   Temp 97.8 F (36.6 C) (Oral)   Ht 5\' 8"  (1.727 m)   Wt 212 lb 6.4 oz (96.3 kg)   SpO2 99%   BMI 32.30 kg/m  General:  well developed, well nourished, in no apparent distress Skin:  no significant moles, warts, or growths Head:  no masses, lesions, or tenderness Eyes:  pupils equal and  round, sclera anicteric without injection Ears:  canals without lesions, TMs shiny without retraction, no obvious effusion, no erythema Nose:  nares patent, septum midline, mucosa normal Throat/Pharynx:  lips and gingiva without lesion; tongue and uvula midline; non-inflamed pharynx; no exudates or postnasal drainage Neck: neck supple without adenopathy, thyromegaly, or masses Lungs:  clear to auscultation, breath sounds equal bilaterally, no respiratory distress Cardio:  regular rate and rhythm, no bruits, no LE edema Abdomen:  abdomen soft, nontender; bowel sounds normal; no masses or organomegaly Rectal: Deferred Musculoskeletal:  symmetrical muscle groups noted without atrophy or deformity Extremities:  no clubbing, cyanosis, or edema, no deformities, no skin discoloration Neuro:  gait normal; deep tendon reflexes normal and symmetric Psych: well oriented with normal range of affect and appropriate judgment/insight  Assessment and Plan  Well adult exam - Plan: Lipid panel  Need for immunization against influenza - Plan: Flu Vaccine QUAD 97mo+IM (Fluarix, Fluzone & Alfiuria Quad PF)   Well 40 y.o. male. Counseled on diet and exercise. Counseled on risks and benefits of prostate cancer screening wit PSA. The patient agrees to undergo screening with labs drawn at last appt.  Other orders as above. Follow up in 1 yr pending the above workup. The patient voiced understanding and agreement to the plan.  [address] [place], DO 08/12/21 8:23 AM

## 2021-08-12 NOTE — Patient Instructions (Signed)
Give us 2-3 business days to get the results of your labs back.   Keep the diet clean and stay active.  I recommend getting the updated bivalent covid vaccination booster at your convenience.   Let us know if you need anything. 

## 2021-09-05 ENCOUNTER — Ambulatory Visit: Payer: No Typology Code available for payment source | Admitting: Podiatry

## 2021-12-19 IMAGING — US US ABDOMEN LIMITED
1 series · 14 of 25 positions shown · non-contrast
Comparison: None.

CLINICAL DATA: Right upper quadrant pain.

EXAM:
ULTRASOUND ABDOMEN LIMITED RIGHT UPPER QUADRANT

[Series 1: us abdomen limited · 14 of 36 slices shown]
[im 1/36]
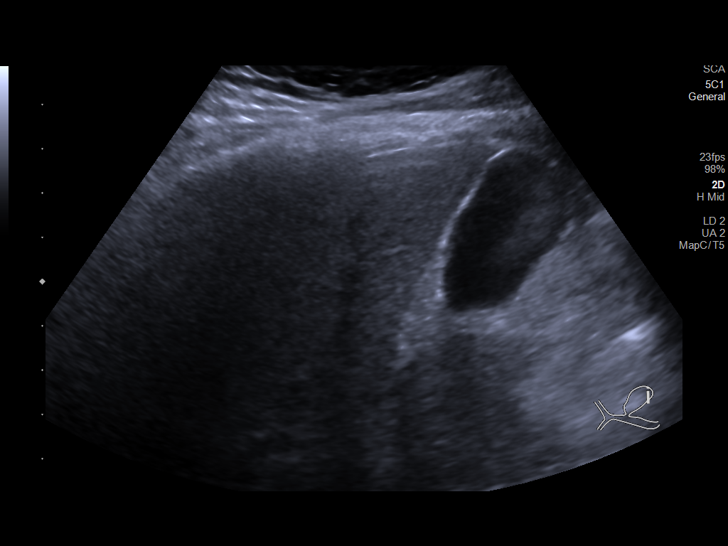
[im 3/36]
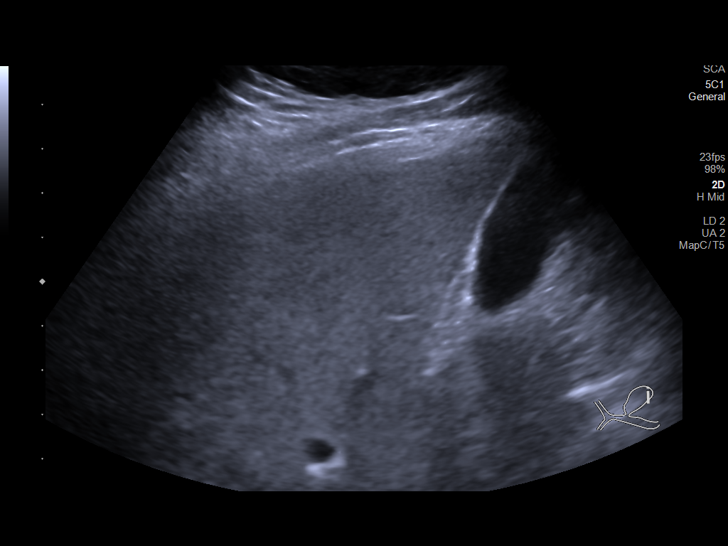
[im 6/36]
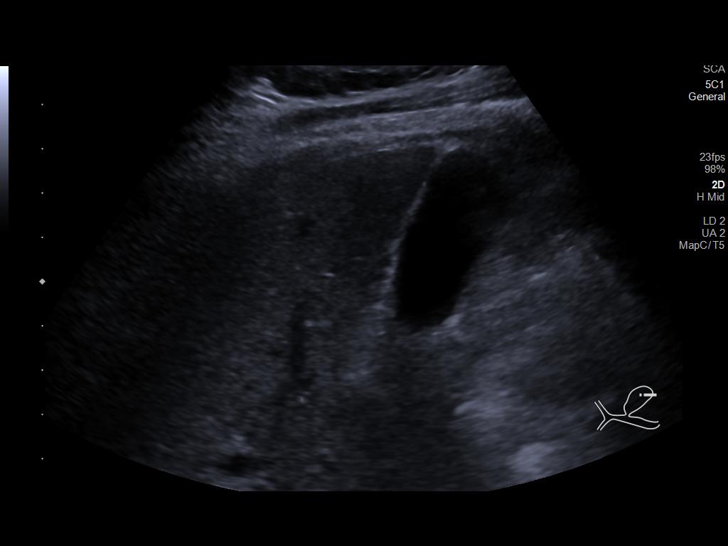
[im 9/36]
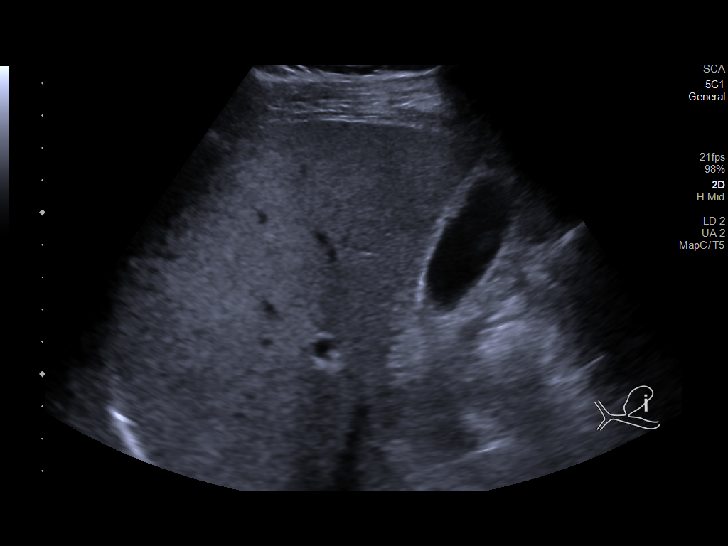
[im 12/36]
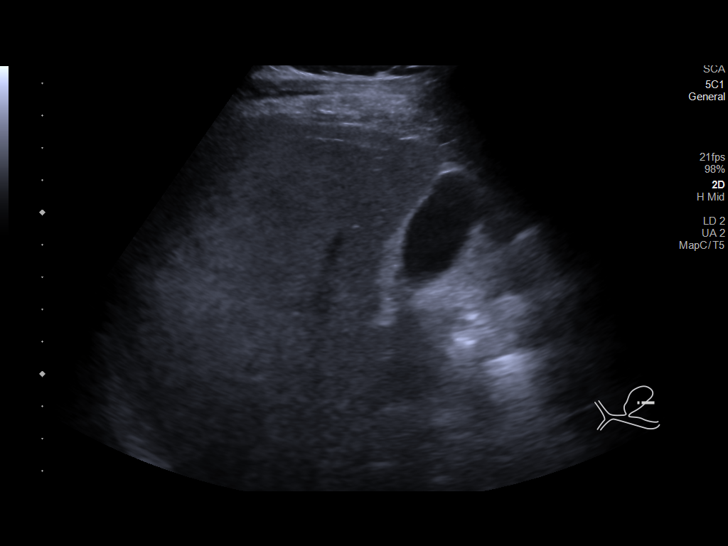
[im 14/36]
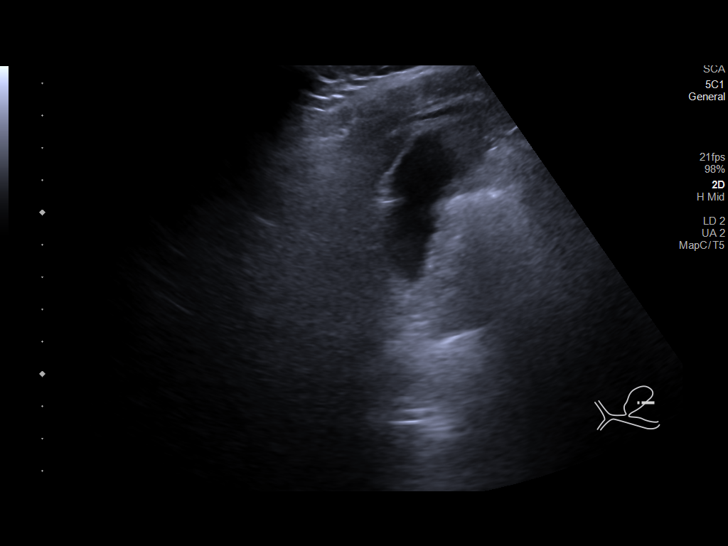
[im 17/36]
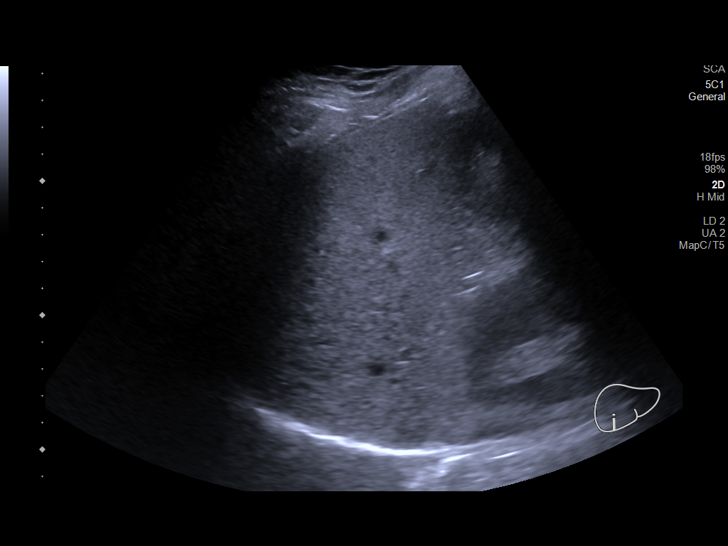
[im 19/36]
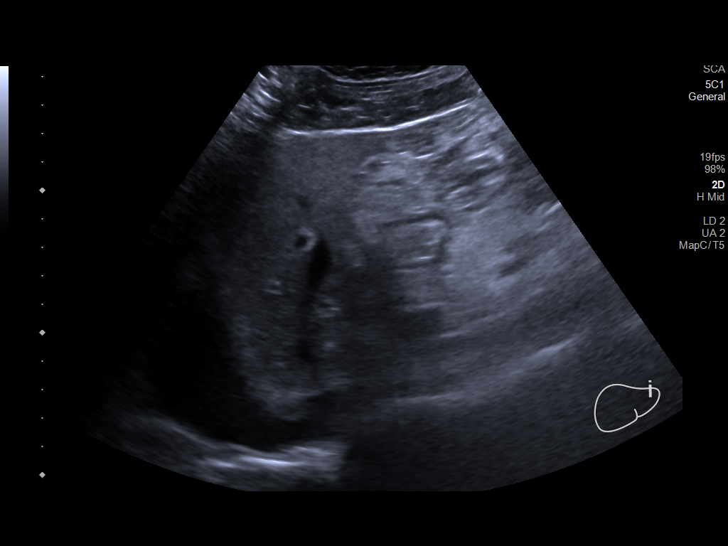
[im 22/36]
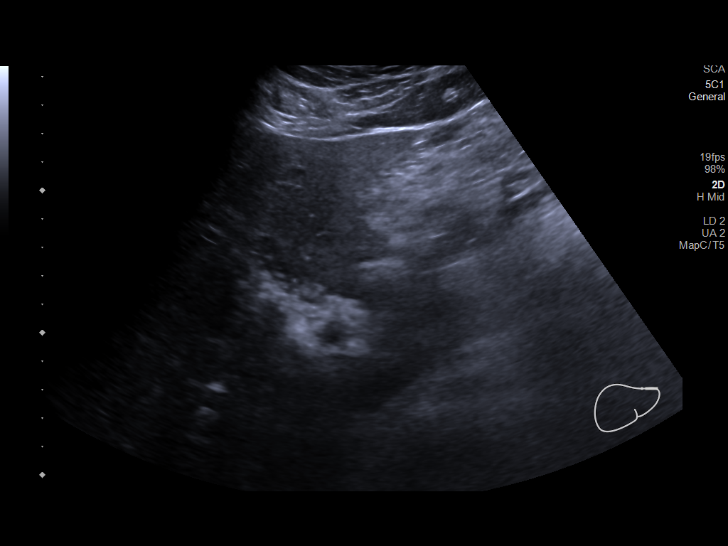
[im 24/36]
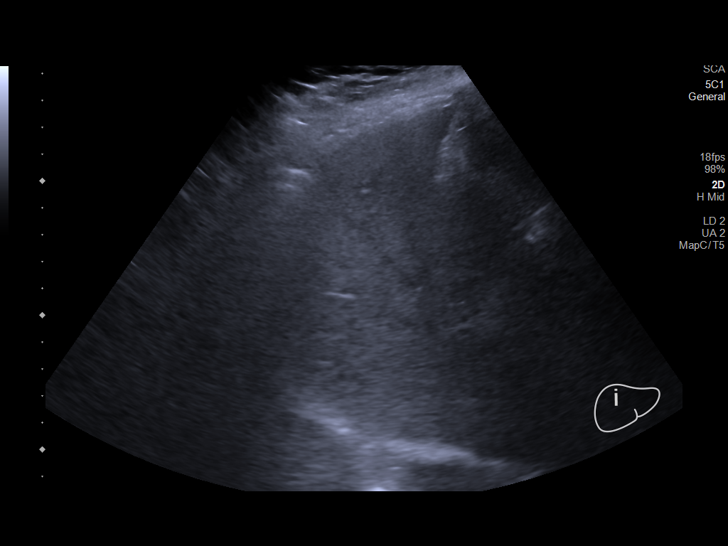
[im 27/36]
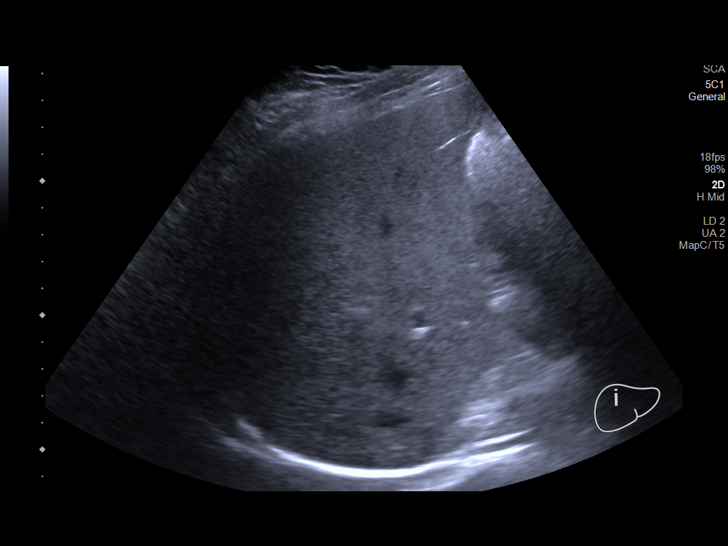
[im 30/36]
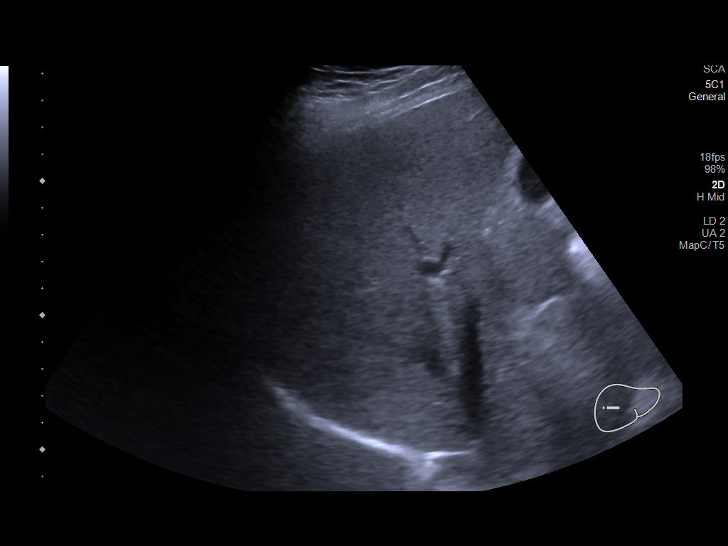
[im 33/36]
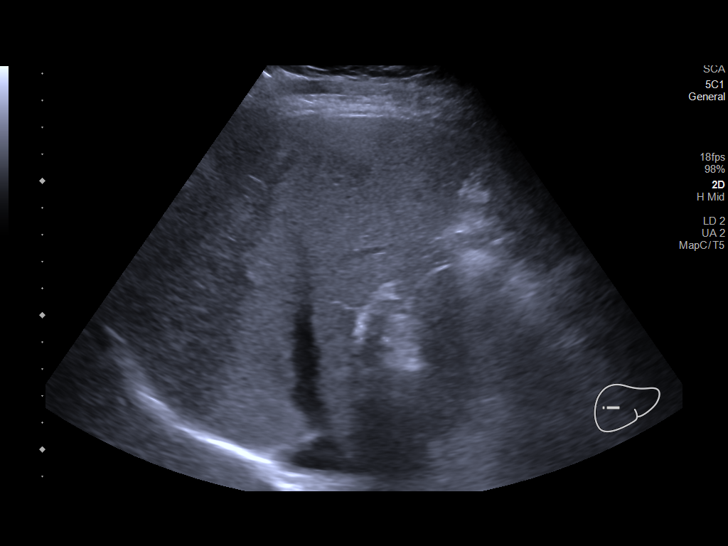
[im 36/36]
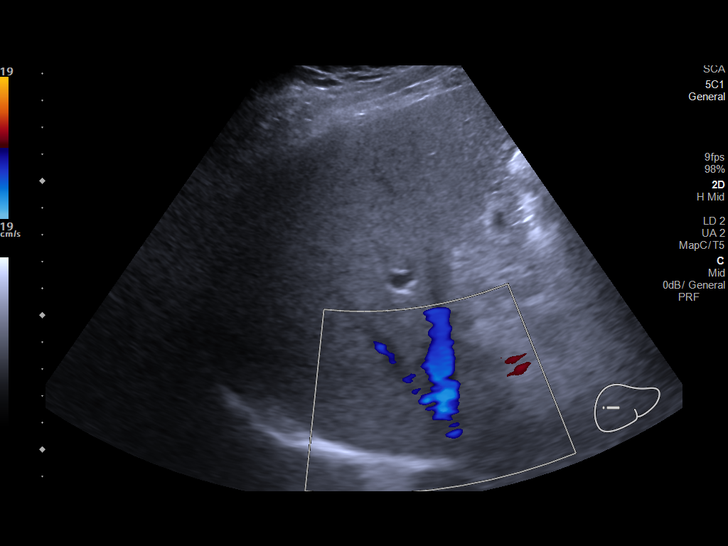

[14 of 25 positions shown; findings below may reference images not displayed]

FINDINGS: Gallbladder:

No gallstones or wall thickening visualized (1.4 mm). No sonographic
Murphy sign noted by sonographer.

Common bile duct:

Diameter: 1.4 mm

Liver:

No focal lesion identified. Diffusely increased echogenicity of the
liver parenchyma is noted. Portal vein is patent on color Doppler
imaging with normal direction of blood flow towards the liver.

Other: None.
IMPRESSION: Hepatic steatosis.

## 2022-08-15 ENCOUNTER — Ambulatory Visit (INDEPENDENT_AMBULATORY_CARE_PROVIDER_SITE_OTHER): Payer: No Typology Code available for payment source | Admitting: Family Medicine

## 2022-08-15 ENCOUNTER — Encounter: Payer: Self-pay | Admitting: Family Medicine

## 2022-08-15 VITALS — BP 110/80 | HR 63 | Temp 97.8°F | Ht 68.0 in | Wt 208.5 lb

## 2022-08-15 DIAGNOSIS — Z125 Encounter for screening for malignant neoplasm of prostate: Secondary | ICD-10-CM

## 2022-08-15 DIAGNOSIS — Z23 Encounter for immunization: Secondary | ICD-10-CM

## 2022-08-15 DIAGNOSIS — Z0000 Encounter for general adult medical examination without abnormal findings: Secondary | ICD-10-CM

## 2022-08-15 LAB — CBC
HCT: 46.7 % (ref 39.0–52.0)
Hemoglobin: 15.8 g/dL (ref 13.0–17.0)
MCHC: 33.9 g/dL (ref 30.0–36.0)
MCV: 91.8 fl (ref 78.0–100.0)
Platelets: 217 10*3/uL (ref 150.0–400.0)
RBC: 5.09 Mil/uL (ref 4.22–5.81)
RDW: 12.5 % (ref 11.5–15.5)
WBC: 5.7 10*3/uL (ref 4.0–10.5)

## 2022-08-15 LAB — COMPREHENSIVE METABOLIC PANEL
ALT: 21 U/L (ref 0–53)
AST: 22 U/L (ref 0–37)
Albumin: 4.4 g/dL (ref 3.5–5.2)
Alkaline Phosphatase: 42 U/L (ref 39–117)
BUN: 11 mg/dL (ref 6–23)
CO2: 29 mEq/L (ref 19–32)
Calcium: 9.1 mg/dL (ref 8.4–10.5)
Chloride: 103 mEq/L (ref 96–112)
Creatinine, Ser: 0.9 mg/dL (ref 0.40–1.50)
GFR: 104.42 mL/min (ref >60.00)
Glucose, Bld: 95 mg/dL (ref 70–99)
Potassium: 4.3 mEq/L (ref 3.5–5.1)
Sodium: 138 mEq/L (ref 135–145)
Total Bilirubin: 1.1 mg/dL (ref 0.2–1.2)
Total Protein: 6.7 g/dL (ref 6.0–8.3)

## 2022-08-15 LAB — PSA: PSA: 0.39 ng/mL (ref 0.10–4.00)

## 2022-08-15 LAB — LIPID PANEL
Cholesterol: 195 mg/dL (ref 0–200)
HDL: 51.3 mg/dL (ref >39.00)
LDL Cholesterol: 124 mg/dL — ABNORMAL HIGH (ref 0–99)
NonHDL: 143.31
Total CHOL/HDL Ratio: 4
Triglycerides: 99 mg/dL (ref 0.0–149.0)
VLDL: 19.8 mg/dL (ref 0.0–40.0)

## 2022-08-15 NOTE — Patient Instructions (Addendum)
Give [place] 2-3 business days to get the results of your labs back.   Keep the diet clean and stay active.  Advanced directive form provided today.   Strengthen your calves. If no improvement in the next 1-2 months, send a message or call and we can set you up with physical therapy.   Let us know if you need anything.  Knee Exercises It is normal to feel mild stretching, pulling, tightness, or discomfort as you do these exercises, but you should stop right away if you feel sudden pain or your pain gets worse.  STRETCHING AND RANGE OF MOTION EXERCISES  These exercises warm up your muscles and joints and improve the movement and flexibility of your knee. These exercises also help to relieve pain, numbness, and tingling. Exercise A: Knee Extension, Prone  Lie on your abdomen on a bed. Place your left / right knee just beyond the edge of the surface so your knee is not on the bed. You can put a towel under your left / right thigh just above your knee for comfort. Relax your leg muscles and allow gravity to straighten your knee. You should feel a stretch behind your left / right knee. Hold this position for 30 seconds. Scoot up so your knee is supported between repetitions. Repeat 2 times. Complete this stretch 3 times per week. Exercise B: Knee Flexion, Active     Lie on your back with both knees straight. If this causes back discomfort, bend your left / right knee so your foot is flat on the floor. Slowly slide your left / right heel back toward your buttocks until you feel a gentle stretch in the front of your knee or thigh. Hold this position for 30 seconds. Slowly slide your left / right heel back to the starting position. Repeat 2 times. Complete this exercise 3 times per week. Exercise C: Quadriceps, Prone     Lie on your abdomen on a firm surface, such as a bed or padded floor. Bend your left / right knee and hold your ankle. If you cannot reach your ankle or pant leg, loop a belt  around your foot and grab the belt instead. Gently pull your heel toward your buttocks. Your knee should not slide out to the side. You should feel a stretch in the front of your thigh and knee. Hold this position for 30 seconds. Repeat 2 times. Complete this stretch 3 times per week. Exercise D: Hamstring, Supine  Lie on your back. Loop a belt or towel over the ball of your left / right foot. The ball of your foot is on the walking surface, right under your toes. Straighten your left / right knee and slowly pull on the belt to raise your leg until you feel a gentle stretch behind your knee. Do not let your left / right knee bend while you do this. Keep your other leg flat on the floor. Hold this position for 30 seconds. Repeat 2 times. Complete this stretch 3 times per week. STRENGTHENING EXERCISES  These exercises build strength and endurance in your knee. Endurance is the ability to use your muscles for a long time, even after they get tired. Exercise E: Quadriceps, Isometric     Lie on your back with your left / right leg extended and your other knee bent. Put a rolled towel or small pillow under your knee if told by your health care provider. Slowly tense the muscles in the front of your left /  right thigh. You should see your kneecap slide up toward your hip or see increased dimpling just above the knee. This motion will push the back of the knee toward the floor. For 3 seconds, keep the muscle as tight as you can without increasing your pain. Relax the muscles slowly and completely. Repeat for 10 total reps Repeat 2 ti mes. Complete this exercise 3 times per week. Exercise F: Straight Leg Raises - Quadriceps  Lie on your back with your left / right leg extended and your other knee bent. Tense the muscles in the front of your left / right thigh. You should see your kneecap slide up or see increased dimpling just above the knee. Your thigh may even shake a bit. Keep these muscles tight  as you raise your leg 4-6 inches (10-15 cm) off the floor. Do not let your knee bend. Hold this position for 3 seconds. Keep these muscles tense as you lower your leg. Relax your muscles slowly and completely after each repetition. 10 total reps. Repeat 2 times. Complete this exercise 3 times per week.  Exercise G: Hamstring Curls     If told by your health care provider, do this exercise while wearing ankle weights. Begin with 5 lb weights (optional). Then increase the weight by 1 lb (0.5 kg) increments. Do not wear ankle weights that are more than 20 lbs to start with. Lie on your abdomen with your legs straight. Bend your left / right knee as far as you can without feeling pain. Keep your hips flat against the floor. Hold this position for 3 seconds. Slowly lower your leg to the starting position. Repeat for 10 reps.  Repeat 2 times. Complete this exercise 3 times per week. Exercise H: Squats (Quadriceps)  Stand in front of a table, with your feet and knees pointing straight ahead. You may rest your hands on the table for balance but not for support. Slowly bend your knees and lower your hips like you are going to sit in a chair. Keep your weight over your heels, not over your toes. Keep your lower legs upright so they are parallel with the table legs. Do not let your hips go lower than your knees. Do not bend lower than told by your health care provider. If your knee pain increases, do not bend as low. Hold the squat position for 1 second. Slowly push with your legs to return to standing. Do not use your hands to pull yourself to standing. Repeat 2 times. Complete this exercise 3 times per week. Exercise I: Wall Slides (Quadriceps)     Lean your back against a smooth wall or door while you walk your feet out 18-24 inches (46-61 cm) from it. Place your feet hip-width apart. Slowly slide down the wall or door until your knees Repeat 2 times. Complete this exercise every other  day. Exercise K: Straight Leg Raises - Hip Abductors  Lie on your side with your left / right leg in the top position. Lie so your head, shoulder, knee, and hip line up. You may bend your bottom knee to help you keep your balance. Roll your hips slightly forward so your hips are stacked directly over each other and your left / right knee is facing forward. Leading with your heel, lift your top leg 4-6 inches (10-15 cm). You should feel the muscles in your outer hip lifting. Do not let your foot drift forward. Do not let your knee roll toward the ceiling. Hold  this position for 3 seconds. Slowly return your leg to the starting position. Let your muscles relax completely after each repetition. 10 total reps. Repeat 2 times. Complete this exercise 3 times per week. Exercise J: Straight Leg Raises - Hip Extensors  Lie on your abdomen on a firm surface. You can put a pillow under your hips if that is more comfortable. Tense the muscles in your buttocks and lift your left / right leg about 4-6 inches (10-15 cm). Keep your knee straight as you lift your leg. Hold this position for 3 seconds. Slowly lower your leg to the starting position. Let your leg relax completely after each repetition. Repeat 2 times. Complete this exercise 3 times per week. Document Released: 08/23/2005 Document Revised: 07/03/2016 Document Reviewed: 08/15/2015 Elsevier Interactive Patient Education  2017 [employer].

## 2022-08-15 NOTE — Progress Notes (Signed)
Chief Complaint  Patient presents with   Annual Exam    Well Male Maurice Roberts is here for a complete physical.   His last physical was >1 year ago.  Current diet: in general, a "pretty good" diet.   Current exercise: cardio, lifting wts Weight trend:has intentionally lost Fatigue out of ordinary? No. Seat belt? Yes.   Advanced directive? No  Health maintenance Tetanus- Yes HIV- Yes Hep C- Yes  Past Medical History:  Diagnosis Date   History of chicken pox    Hyperhidrosis    Tinea versicolor      Past Surgical History:  Procedure Laterality Date   KNEE SURGERY  1993    Medications  Current Outpatient Medications on File Prior to Visit  Medication Sig Dispense Refill   ketoconazole (NIZORAL) 2 % shampoo Apply 1 application topically 3 (three) times a week.      Allergies Allergies  Allergen Reactions   Penicillins     Family History Family History  Problem Relation Age of Onset   Cancer Maternal Grandmother        Breast     Review of Systems: Constitutional: no fevers or chills Eye:  no recent significant change in vision Ear/Nose/Mouth/Throat:  Ears:  no hearing loss Nose/Mouth/Throat:  no complaints of nasal congestion, no sore throat Cardiovascular:  no chest pain Respiratory:  no shortness of breath Gastrointestinal:  no abdominal pain, no change in bowel habits GU:  Male: negative for dysuria, frequency, and incontinence Musculoskeletal/Extremities:  no pain of the joints Integumentary (Skin/Breast):  no abnormal skin lesions reported Neurologic:  no headaches Endocrine: No unexpected weight changes Hematologic/Lymphatic:  no night sweats  Exam BP 110/80 (BP Location: Left Arm, Patient Position: Sitting, Cuff Size: Normal)   Pulse 63   Temp 97.8 F (36.6 C) (Oral)   Ht 5\' 8"  (1.727 m)   Wt 208 lb 8 oz (94.6 kg)   SpO2 98%   BMI 31.70 kg/m  General:  well developed, well nourished, in no apparent distress Skin:  no significant moles,  warts, or growths Head:  no masses, lesions, or tenderness Eyes:  pupils equal and round, sclera anicteric without injection Ears:  canals without lesions, TMs shiny without retraction, no obvious effusion, no erythema Nose:  nares patent, mucosa normal Throat/Pharynx:  lips and gingiva without lesion; tongue and uvula midline; non-inflamed pharynx; no exudates or postnasal drainage Neck: neck supple without adenopathy, thyromegaly, or masses Lungs:  clear to auscultation, breath sounds equal bilaterally, no respiratory distress Cardio:  regular rate and rhythm, no bruits, no LE edema Abdomen:  abdomen soft, nontender; bowel sounds normal; no masses or organomegaly Rectal: Deferred Musculoskeletal:  symmetrical muscle groups noted without atrophy or deformity Extremities:  no clubbing, cyanosis, or edema, no deformities, no skin discoloration Neuro:  gait normal; deep tendon reflexes normal and symmetric Psych: well oriented with normal range of affect and appropriate judgment/insight  Assessment and Plan  Well adult exam - Plan: CBC, Comprehensive metabolic panel, Lipid panel  Screening for prostate cancer - Plan: PSA  Need for influenza vaccination - Plan: Flu Vaccine QUAD 6+ mos PF IM (Fluarix Quad PF)   Well 41 y.o. male. Counseled on diet and exercise. Counseled on risks and benefits of prostate cancer screening with PSA. The patient agrees to undergo screening.  Flu shot today. 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 Will start to strengthen in gym. If not improved, will message and we will set up w PT to address gait  abnormalities. Knee stretches given also to use prn.  Advanced directive form provided today.  Other orders as above. Follow up in 1 yr pending the above workup. The patient voiced understanding and agreement to the plan.  [place], DO 08/15/22 7:49 AM

## 2022-08-16 ENCOUNTER — Telehealth: Payer: Self-pay | Admitting: Family Medicine

## 2022-08-16 NOTE — Telephone Encounter (Signed)
Patient called to advise that he is trying to get window tint put on his car due to some skin cancer scares and he has to fill out paperwork with [employer] to obtain the permit to allow for this. He has completed the form up until the place that requires physician signature. He said he can get the paperwork to [place] for Dr. Nani Ravens to sign if it he is okay to sign it. Please call to advise.

## 2022-08-16 NOTE — Telephone Encounter (Signed)
He did speak to his Derm and they said they preferred a clear ceramic and not tinted. He had sent derm and PCP a message at the same time. He will not go with Derm as does not even know what they are referring to. He will drop off form for PCP to look at and see if able to do if not then no worries.

## 2022-08-16 NOTE — Telephone Encounter (Signed)
Called left message to call back 

## 2022-08-16 NOTE — Telephone Encounter (Signed)
Pt called back. °

## 2022-08-17 ENCOUNTER — Telehealth: Payer: Self-pay | Admitting: Family Medicine

## 2022-08-17 NOTE — Telephone Encounter (Signed)
Pt dropped off document to be filled out by provider ([employer] [place] 3 pages) Pt would like to be called when document ready or if there is any questions. Pt stated is requesting a 5 year permit. Document put at front office tray under providers name.

## 2022-08-18 NOTE — Telephone Encounter (Signed)
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 Since that time he goes back every 6 months to be rechecked, has had other places removed but none were cancer.

## 2022-08-18 NOTE — Telephone Encounter (Signed)
Paperwork completed Made a copy for scan Called the patient informed ready for pickup.

## 2022-08-18 NOTE — Telephone Encounter (Signed)
I think derm recommended ceramic tint so I am OK doing this as it is not a lot to do. What is the diagnosis? Does he have a hx of skin cancer or is it precancer?

## 2022-08-24 ENCOUNTER — Encounter: Payer: Self-pay | Admitting: Family Medicine

## 2023-01-02 ENCOUNTER — Ambulatory Visit (INDEPENDENT_AMBULATORY_CARE_PROVIDER_SITE_OTHER): Payer: No Typology Code available for payment source | Admitting: Podiatry

## 2023-01-02 DIAGNOSIS — R52 Pain, unspecified: Secondary | ICD-10-CM | POA: Diagnosis not present

## 2023-01-02 DIAGNOSIS — L600 Ingrowing nail: Secondary | ICD-10-CM | POA: Diagnosis not present

## 2023-01-02 NOTE — Patient Instructions (Signed)

## 2023-01-02 NOTE — Progress Notes (Signed)
Subjective: Chief Complaint  Patient presents with   Ingrown Toenail    RIGHT GREAT HALLUX, PROBLEM HAS OCCURRED FOR A FEW MONTHS    42 year old male presents the office for above concerns.  Points to the right lateral nail border which is causing discomfort as does get ingrown.  He tries to trim it out himself.  Typically the medial border he states that that side is doing fine now with the other side.  Currently denies any drainage or pus.   Objective: AAO x3, NAD DP/PT pulses palpable bilaterally, CRT less than 3 seconds Incurvation present to the right lateral nail border with slight edema but there is no erythema, drainage or pus or any signs of infection.  Not experiencing tenderness today but he has been experiencing ongoing tenderness this corner for several months. No pain with calf compression, swelling, warmth, erythema  Assessment: Ingrown toenail right lateral nail border  Plan: -All treatment options discussed with the patient including all alternatives, risks, complications.  -At this time, the patient is requesting partial nail removal with chemical matricectomy to the symptomatic portion of the nail. Risks and complications were discussed with the patient for which they understand and written consent was obtained. Under sterile conditions a total of 3 mL of a mixture of 2% lidocaine plain and 0.5% Marcaine plain was infiltrated in a hallux block fashion. Once anesthetized, the skin was prepped in sterile fashion. A tourniquet was then applied. Next the lateral aspect of hallux nail border was then sharply excised making sure to remove the entire offending nail border. Once the nails were ensured to be removed area was debrided and the underlying skin was intact. There is no purulence identified in the procedure. Next phenol was then applied under standard conditions and copiously irrigated.  Silvadene was applied. A dry sterile dressing was applied. After application of the  dressing the tourniquet was removed and there is found to be an immediate capillary refill time to the digit. The patient tolerated the procedure well any complications. Post procedure instructions were discussed the patient for which he verbally understood. Discussed signs/symptoms of infection and directed to call the office immediately should any occur or go directly to the emergency room. In the meantime, encouraged to call the office with any questions, concerns, changes symptoms. -Patient encouraged to call the office with any questions, concerns, change in symptoms.   Vivi Barrack DPM

## 2023-02-09 ENCOUNTER — Ambulatory Visit (INDEPENDENT_AMBULATORY_CARE_PROVIDER_SITE_OTHER): Payer: No Typology Code available for payment source | Admitting: Podiatry

## 2023-02-09 DIAGNOSIS — L03031 Cellulitis of right toe: Secondary | ICD-10-CM

## 2023-02-09 MED ORDER — DOXYCYCLINE HYCLATE 100 MG PO TABS: 100.0000 mg | ORAL_TABLET | Freq: Two times a day (BID) | ORAL | 0 refills | Status: DC

## 2023-02-11 NOTE — Progress Notes (Signed)
Subjective:   Patient ID: Maurice Roberts, male   DOB: 42 y.o.   MRN: [id number]   HPI Patient presents stating that the right nail is sensitive and he is concerned that its increased in discoloration over the last week.  States that the healing up to a point was uneventful    ROS      Objective:  Physical Exam  Neurovascular status intact with patient's right hallux lateral side showing redness at the proximal nail fold localized with nothing extending to the inner phalangeal joint or proximal.  Moderately tender when pressed no other pathology noted     Assessment:  Probability for low-grade paronychia during the healing process which may have caused a low-grade infective process      Plan:  Reviewed condition with patient educating him on this and at this point as precautionary measure placed him on a doxycycline regimen for 10 days to a day and he can do soaks if there is any drainage.  If this does not get better we will have to open the area up which I explained to him today

## 2023-02-13 ENCOUNTER — Telehealth: Payer: Self-pay | Admitting: Podiatry

## 2023-02-13 NOTE — Telephone Encounter (Signed)
Pt calling about his foot. Pt ws wanting to see if he needs another appointment for his R Foot he is still in a lot of pain with redness & swelling. Pt  is 5 days into his antibiotics.   Please advise

## 2023-02-14 NOTE — Telephone Encounter (Signed)
I'd have him come in tomorrow if possible

## 2023-02-15 ENCOUNTER — Encounter: Payer: Self-pay | Admitting: Podiatry

## 2023-02-15 ENCOUNTER — Ambulatory Visit (INDEPENDENT_AMBULATORY_CARE_PROVIDER_SITE_OTHER): Payer: No Typology Code available for payment source | Admitting: Podiatry

## 2023-02-15 ENCOUNTER — Ambulatory Visit (INDEPENDENT_AMBULATORY_CARE_PROVIDER_SITE_OTHER): Payer: No Typology Code available for payment source

## 2023-02-15 DIAGNOSIS — L03031 Cellulitis of right toe: Secondary | ICD-10-CM | POA: Diagnosis not present

## 2023-02-15 NOTE — Patient Instructions (Signed)

## 2023-02-15 NOTE — Progress Notes (Signed)
Subjective:   Patient ID: Maurice Roberts, male   DOB: 42 y.o.   MRN: [id number]   HPI Patient states his big toe is still hurting and it has been a little bit red in the corner and it still sore with shoe gear   ROS      Objective:  Physical Exam  Neurovascular status intact with inflammation of the lateral border of the right hallux localized no active proximal drainage noted painful in the spot     Assessment:  Paronychia infection right hallux lateral that has not been responding so far     Plan:  I anesthetized the right hallux opened up the lateral side did not note active pus but I flushed the area out applied sterile dressing begin soaks reappoint if any issues were to occur finish antibiotic.  X-rays were negative for signs of bone infection appears to be soft tissue

## 2023-02-16 ENCOUNTER — Telehealth: Payer: Self-pay | Admitting: Family Medicine

## 2023-02-16 ENCOUNTER — Other Ambulatory Visit: Payer: Self-pay | Admitting: Family Medicine

## 2023-02-16 DIAGNOSIS — F418 Other specified anxiety disorders: Secondary | ICD-10-CM

## 2023-02-16 MED ORDER — LORAZEPAM 1 MG PO TABS: ORAL_TABLET | ORAL | 1 refills | Status: DC

## 2023-02-16 NOTE — Telephone Encounter (Signed)
Not on current list Last OV---08/15/2022

## 2023-02-16 NOTE — Telephone Encounter (Signed)
Prescription Request  02/16/2023  Is this a "Controlled Substance" medicine? No  LOV: Visit date not found  What is the name of the medication or equipment?  LORazepam (ATIVAN) 1 MG tablet [[id number]]   Have you contacted your pharmacy to request a refill? No   Which pharmacy would you like this sent to?  [id number] - [place], [place] - [address] [place] [address] Phone: [phone] Fax: [phone]    Patient notified that their request is being sent to the clinical staff for review and that they should receive a response within 2 business days.   Please advise at Mobile [phone] (mobile)

## 2023-03-02 ENCOUNTER — Other Ambulatory Visit (HOSPITAL_COMMUNITY): Payer: Self-pay

## 2023-07-06 ENCOUNTER — Encounter: Payer: Self-pay | Admitting: Family Medicine

## 2023-07-06 ENCOUNTER — Ambulatory Visit (INDEPENDENT_AMBULATORY_CARE_PROVIDER_SITE_OTHER): Payer: No Typology Code available for payment source | Admitting: Family Medicine

## 2023-07-06 VITALS — BP 118/72 | HR 65 | Temp 98.4°F | Ht 68.0 in | Wt 213.2 lb

## 2023-07-06 DIAGNOSIS — R1011 Right upper quadrant pain: Secondary | ICD-10-CM

## 2023-07-06 NOTE — Progress Notes (Signed)
Chief Complaint  Patient presents with   Abdominal Pain    For one month Off and on pain is better when he stands Soreness when applying pressure     Maurice Roberts is here for RUQ abdominal pain.  Duration: 3 weeks Nighttime awakenings? No Bleeding? No Weight loss? No Palliation: standing Provocation: applying pressure, eating Associated symptoms:  some pain when lying Denies: fever, nausea, vomiting, and bowel changes Treatment to date: stretching  Past Medical History:  Diagnosis Date   History of chicken pox    Hyperhidrosis    Tinea versicolor     BP 118/72 (BP Location: Left Arm, Patient Position: Sitting, Cuff Size: Normal)   Pulse 65   Temp 98.4 F (36.9 C) (Oral)   Ht 5\' 8"  (1.727 m)   Wt 213 lb 4 oz (96.7 kg)   SpO2 99%   BMI 32.42 kg/m  Gen.: Awake, alert, appears stated age HEENT: Mucous membranes moist without mucosal lesions Heart: Regular rate and rhythm without murmurs Lungs: Clear auscultation bilaterally, no rales or wheezing, normal effort without accessory muscle use. Abdomen: Bowel sounds are present. Abdomen is soft, nontender, nondistended, no masses or organomegaly. Negative Murphy's, Rovsing's, McBurney's, and Carnett's sign. MSK: He is actually more tender over the right lower rib cage just medial to the anterior axillary line around rib 10.  There is no deformity, crepitus, or edema appreciated. Psych: Age appropriate judgment and insight. Normal mood and affect.  RUQ abdominal pain - Plan: US ABDOMEN LIMITED RUQ (LIVER/GB)  Encouraged to stretch his core.  Heat, ice, Tylenol.  Check ultrasound of the gallbladder given his postprandial symptoms.  If ultrasound is negative and he is still having issues, would consider physical therapy versus HIDA scan. F/u as originally scheduled Pt voiced understanding and agreement to the plan.  Jilda Roche [place], DO 07/06/23 11:15 AM

## 2023-07-06 NOTE — Patient Instructions (Addendum)
Someone will be in touch regarding your ultrasound in the next few business days.   Ice/cold pack over area for 10-15 min twice daily.  Heat (pad or rice pillow in microwave) over affected area, 10-15 minutes twice daily.   OK to take Tylenol 1000 mg (2 extra strength tabs) or 975 mg (3 regular strength tabs) every 6 hours as needed. Stretch your core. Hold stretches for 30 seconds and repeat 3 times.   Let us know if you need anything.

## 2023-07-15 ENCOUNTER — Ambulatory Visit (HOSPITAL_BASED_OUTPATIENT_CLINIC_OR_DEPARTMENT_OTHER): Payer: No Typology Code available for payment source

## 2023-08-20 ENCOUNTER — Ambulatory Visit (INDEPENDENT_AMBULATORY_CARE_PROVIDER_SITE_OTHER): Payer: No Typology Code available for payment source | Admitting: Family Medicine

## 2023-08-20 ENCOUNTER — Encounter: Payer: Self-pay | Admitting: Family Medicine

## 2023-08-20 VITALS — BP 118/64 | HR 64 | Temp 98.0°F | Resp 16 | Ht 68.0 in | Wt 214.0 lb

## 2023-08-20 DIAGNOSIS — Z23 Encounter for immunization: Secondary | ICD-10-CM | POA: Diagnosis not present

## 2023-08-20 DIAGNOSIS — Z125 Encounter for screening for malignant neoplasm of prostate: Secondary | ICD-10-CM

## 2023-08-20 DIAGNOSIS — R1319 Other dysphagia: Secondary | ICD-10-CM | POA: Diagnosis not present

## 2023-08-20 DIAGNOSIS — Z0000 Encounter for general adult medical examination without abnormal findings: Secondary | ICD-10-CM | POA: Diagnosis not present

## 2023-08-20 DIAGNOSIS — Z1211 Encounter for screening for malignant neoplasm of colon: Secondary | ICD-10-CM

## 2023-08-20 LAB — LIPID PANEL
Cholesterol: 166 mg/dL (ref 0–200)
HDL: 47 mg/dL (ref >39.00)
LDL Cholesterol: 100 mg/dL — ABNORMAL HIGH (ref 0–99)
NonHDL: 118.72
Total CHOL/HDL Ratio: 4
Triglycerides: 92 mg/dL (ref 0.0–149.0)
VLDL: 18.4 mg/dL (ref 0.0–40.0)

## 2023-08-20 LAB — COMPREHENSIVE METABOLIC PANEL
ALT: 19 U/L (ref 0–53)
AST: 19 U/L (ref 0–37)
Albumin: 4.3 g/dL (ref 3.5–5.2)
Alkaline Phosphatase: 44 U/L (ref 39–117)
BUN: 12 mg/dL (ref 6–23)
CO2: 28 meq/L (ref 19–32)
Calcium: 9.1 mg/dL (ref 8.4–10.5)
Chloride: 105 meq/L (ref 96–112)
Creatinine, Ser: 0.91 mg/dL (ref 0.40–1.50)
GFR: 102.32 mL/min (ref >60.00)
Glucose, Bld: 94 mg/dL (ref 70–99)
Potassium: 4.4 meq/L (ref 3.5–5.1)
Sodium: 139 meq/L (ref 135–145)
Total Bilirubin: 0.8 mg/dL (ref 0.2–1.2)
Total Protein: 6.7 g/dL (ref 6.0–8.3)

## 2023-08-20 LAB — CBC
HCT: 47.9 % (ref 39.0–52.0)
Hemoglobin: 15.8 g/dL (ref 13.0–17.0)
MCHC: 32.9 g/dL (ref 30.0–36.0)
MCV: 92.6 fL (ref 78.0–100.0)
Platelets: 210 10*3/uL (ref 150.0–400.0)
RBC: 5.17 Mil/uL (ref 4.22–5.81)
RDW: 12.6 % (ref 11.5–15.5)
WBC: 6.1 10*3/uL (ref 4.0–10.5)

## 2023-08-20 LAB — PSA: PSA: 0.63 ng/mL (ref 0.10–4.00)

## 2023-08-20 NOTE — Patient Instructions (Signed)
Give us 2-3 business days to get the results of your labs back.   Keep the diet clean and stay active.  If you do not hear anything about your referral in the next 1-2 weeks, call our office and ask for an update.  Please get me a copy of your advanced directive form at your convenience.   Let us know if you need anything. 

## 2023-08-20 NOTE — Progress Notes (Signed)
Chief Complaint  Patient presents with   Annual Exam    Annual Exam    Well Male Maurice Roberts is here for a complete physical.   His last physical was >1 year ago.  Current diet: in general, a "healthy" diet.   Current exercise: walking, running Weight trend: stable Fatigue out of ordinary? No. Seat belt? Yes.   Advanced directive? No  Health maintenance Tetanus- Yes HIV- Yes Hep C- Yes  Past Medical History:  Diagnosis Date   History of chicken pox    Hyperhidrosis    Tinea versicolor      Past Surgical History:  Procedure Laterality Date   KNEE SURGERY  1993    Medications  Current Outpatient Medications on File Prior to Visit  Medication Sig Dispense Refill   LORazepam (ATIVAN) 1 MG tablet Take 1 tablet by mouth before flying. 20 tablet 1   Allergies Allergies  Allergen Reactions   Penicillins     Family History Family History  Problem Relation Age of Onset   Cancer Maternal Grandmother        Breast     Review of Systems: Constitutional: no fevers or chills Eye:  no recent significant change in vision Ear/Nose/Mouth/Throat:  Ears:  no hearing loss Nose/Mouth/Throat:  no complaints of nasal congestion, no sore throat Cardiovascular:  no chest pain Respiratory:  no shortness of breath Gastrointestinal:  no abdominal pain, no change in bowel habits GU:  Male: negative for dysuria, frequency, and incontinence Musculoskeletal/Extremities:  no pain of the joints Integumentary (Skin/Breast):  no abnormal skin lesions reported Neurologic:  no headaches Endocrine: No unexpected weight changes Hematologic/Lymphatic:  no night sweats  Exam BP 118/64 (BP Location: Left Arm, Patient Position: Sitting, Cuff Size: Normal)   Pulse 64   Temp 98 F (36.7 C) (Oral)   Resp 16   Ht 5\' 8"  (1.727 m)   Wt 214 lb (97.1 kg)   SpO2 99%   BMI 32.54 kg/m  General:  well developed, well nourished, in no apparent distress Skin:  no significant moles, warts, or  growths Head:  no masses, lesions, or tenderness Eyes:  pupils equal and round, sclera anicteric without injection Ears:  canals without lesions, TMs shiny without retraction, no obvious effusion, no erythema Nose:  nares patent, mucosa normal Throat/Pharynx:  lips and gingiva without lesion; tongue and uvula midline; non-inflamed pharynx; no exudates or postnasal drainage Neck: neck supple without adenopathy, thyromegaly, or masses Lungs:  clear to auscultation, breath sounds equal bilaterally, no respiratory distress Cardio:  regular rate and rhythm, no bruits, no LE edema Abdomen:  abdomen soft, nontender; bowel sounds normal; no masses or organomegaly Rectal: Deferred Musculoskeletal:  symmetrical muscle groups noted without atrophy or deformity Extremities:  no clubbing, cyanosis, or edema, no deformities, no skin discoloration Neuro:  gait normal; deep tendon reflexes normal and symmetric Psych: well oriented with normal range of affect and appropriate judgment/insight  Assessment and Plan  Well adult exam - Plan: CBC, Comprehensive metabolic panel, Lipid panel  Screening for prostate cancer - Plan: PSA  Esophageal dysphagia - Plan: Ambulatory referral to Gastroenterology  Screen for colon cancer - Plan: Ambulatory referral to Gastroenterology   Well 42 y.o. male. Counseled on diet and exercise. Counseled on risks and benefits of prostate cancer screening with PSA. The patient agrees to undergo screening.  Flu shot today.  Has intermittent dysphagia (3-4 times per year). Probably inadequate mastication. Will place non-urgent referral to GI to discuss, also due for reg  CCS coming up. Advanced directive form provided today.  Other orders as above. Follow up in 1 yr pending the above workup. The patient voiced understanding and agreement to the plan.  Jilda Roche Kingman, DO 08/20/23 7:42 AM

## 2023-09-06 ENCOUNTER — Encounter: Payer: Self-pay | Admitting: Gastroenterology

## 2023-10-12 ENCOUNTER — Ambulatory Visit (INDEPENDENT_AMBULATORY_CARE_PROVIDER_SITE_OTHER): Payer: No Typology Code available for payment source | Admitting: Gastroenterology

## 2023-10-12 ENCOUNTER — Encounter: Payer: Self-pay | Admitting: Gastroenterology

## 2023-10-12 VITALS — BP 120/84 | HR 86 | Ht 68.0 in | Wt 217.0 lb

## 2023-10-12 DIAGNOSIS — K219 Gastro-esophageal reflux disease without esophagitis: Secondary | ICD-10-CM | POA: Diagnosis not present

## 2023-10-12 DIAGNOSIS — Z1211 Encounter for screening for malignant neoplasm of colon: Secondary | ICD-10-CM

## 2023-10-12 DIAGNOSIS — R1310 Dysphagia, unspecified: Secondary | ICD-10-CM | POA: Diagnosis not present

## 2023-10-12 DIAGNOSIS — R1011 Right upper quadrant pain: Secondary | ICD-10-CM

## 2023-10-12 MED ORDER — NA SULFATE-K SULFATE-MG SULF 17.5-3.13-1.6 GM/177ML PO SOLN: 1.0000 | ORAL | 0 refills | Status: DC

## 2023-10-12 NOTE — Progress Notes (Signed)
Chief Complaint: GERD, Dysphagia, RUQ pain, screening for colon cancer Primary GI MD: Gentry Fitz  HPI: 42 year old male with medical history as listed below presents for evaluation of GERD, dysphagia, RUQ pain, and screening for colon cancer.  Patient states he has had intermittent dysphagia ongoing for many years that occurs about once a month.  He initially thought this was secondary to eating too quickly but then he noticed it would occur even if he was eating at a normal pace.  Dysphagia is only to solids.  Feels like it gets stuck in his mid chest.  He has intermittent GERD which only occurs with trigger foods.  He states if he eats clean he has no symptoms.  He also reports longstanding history of RUQ pain.  He is unsure if this is due to his gallbladder or of musculoskeletal etiology.  States occasionally he will bend over and feel a spasm in his RUQ if he stretches it resolves.  However, he also has RUQ pain that occurs when he eats fatty/greasy food.  Had a RUQ ultrasound in 2022 that showed no gallstones, normal liver.  If he eats healthy he does not have this pain as much.  No previous history of screening colonoscopy.  He turns 43 in January and that is initially why he made this consultation.  Recently seen by PCP with normal CBC and CMP.  No family history of colon cancer.  No liver or GI issues.  Denies frequent NSAID use.  Past Medical History:  Diagnosis Date   History of chicken pox    Hyperhidrosis    Tinea versicolor     Past Surgical History:  Procedure Laterality Date   KNEE SURGERY  1993    Current Outpatient Medications  Medication Sig Dispense Refill   LORazepam (ATIVAN) 1 MG tablet Take 1 tablet by mouth before flying. 20 tablet 1   No current facility-administered medications for this visit.    Allergies as of 10/12/2023 - Review Complete 10/12/2023  Allergen Reaction Noted   Penicillins  03/13/2017    Family History  Problem Relation Age of  Onset   Cancer Maternal Grandmother        Breast    Pancreatic cancer Neg Hx    Stomach cancer Neg Hx    Colon cancer Neg Hx    Esophageal cancer Neg Hx     Social History   Socioeconomic History   Marital status: Married    Spouse name: Not on file   Number of children: Not on file   Years of education: Not on file   Highest education level: Not on file  Occupational History   Not on file  Tobacco Use   Smoking status: Former    Current packs/day: 0.00    Types: Cigarettes    Quit date: 03/23/2002    Years since quitting: 21.5   Smokeless tobacco: Never  Vaping Use   Vaping status: Never Used  Substance and Sexual Activity   Alcohol use: Yes    Alcohol/week: 2.0 standard drinks of alcohol    Types: 2 Cans of beer per week    Comment: Pt now drinking 5 per week   Drug use: No   Sexual activity: Not on file  Other Topics Concern   Not on file  Social History Narrative   Not on file   Social Drivers of Health   Financial Resource Strain: Not on file  Food Insecurity: Not on file  Transportation Needs: Not on file  Physical Activity: Not on file  Stress: Not on file  Social Connections: Not on file  Intimate Partner Violence: Not on file    Review of Systems:    Constitutional: No weight loss, fever, chills, weakness or fatigue HEENT: Eyes: No change in vision               Ears, Nose, Throat:  No change in hearing or congestion Skin: No rash or itching Cardiovascular: No chest pain, chest pressure or palpitations   Respiratory: No SOB or cough Gastrointestinal: See HPI and otherwise negative Genitourinary: No dysuria or change in urinary frequency Neurological: No headache, dizziness or syncope Musculoskeletal: No new muscle or joint pain Hematologic: No bleeding or bruising Psychiatric: No history of depression or anxiety    Physical Exam:  Vital signs: BP 120/84 (BP Location: Right Arm, Patient Position: Sitting, Cuff Size: Normal)   Pulse 86   Ht  5\' 8"  (1.727 m)   Wt 217 lb (98.4 kg)   SpO2 95%   BMI 32.99 kg/m   Constitutional: NAD, Well developed, Well nourished, alert and cooperative Head:  Normocephalic and atraumatic. Eyes:   PEERL, EOMI. No icterus. Conjunctiva pink. Respiratory: Respirations even and unlabored. Lungs clear to auscultation bilaterally.   No wheezes, crackles, or rhonchi.  Cardiovascular:  Regular rate and rhythm. No peripheral edema, cyanosis or pallor.  Gastrointestinal:  Soft, nondistended, nontender. No rebound or guarding. Normal bowel sounds. No appreciable masses or hepatomegaly. Rectal:  Not performed.  Msk:  Symmetrical without gross deformities. Without edema, no deformity or joint abnormality.  Neurologic:  Alert and  oriented x4;  grossly normal neurologically.  Skin:   Dry and intact without significant lesions or rashes. Psychiatric: Oriented to person, place and time. Demonstrates good judgement and reason without abnormal affect or behaviors.   RELEVANT LABS AND IMAGING: CBC    Component Value Date/Time   WBC 6.1 08/20/2023 0750   RBC 5.17 08/20/2023 0750   HGB 15.8 08/20/2023 0750   HCT 47.9 08/20/2023 0750   PLT 210.0 08/20/2023 0750   MCV 92.6 08/20/2023 0750   MCHC 32.9 08/20/2023 0750   RDW 12.6 08/20/2023 0750    CMP     Component Value Date/Time   NA 139 08/20/2023 0750   K 4.4 08/20/2023 0750   CL 105 08/20/2023 0750   CO2 28 08/20/2023 0750   GLUCOSE 94 08/20/2023 0750   BUN 12 08/20/2023 0750   CREATININE 0.91 08/20/2023 0750   CREATININE 0.86 08/10/2020 0824   CALCIUM 9.1 08/20/2023 0750   PROT 6.7 08/20/2023 0750   ALBUMIN 4.3 08/20/2023 0750   AST 19 08/20/2023 0750   ALT 19 08/20/2023 0750   ALKPHOS 44 08/20/2023 0750   BILITOT 0.8 08/20/2023 0750     Assessment/Plan:   Dysphagia, unspecified type Gastroesophageal reflux disease, unspecified whether esophagitis present Dysphagia occurring once a month to solid foods and intermittent GERD worse with  trigger foods.  Patient would like an EGD with his colonoscopy.  Not on any antacid regimen.  No symptoms if he avoids trigger foods.  DDx includes reflux, globus sensation, esophagitis, gastritis - EGD for further evaluation - I thoroughly discussed the procedure with the patient (at bedside) to include nature of the procedure, alternatives, benefits, and risks (including but not limited to bleeding, infection, perforation, anesthesia/cardiac pulmonary complications).  Patient verbalized understanding and gave verbal consent to proceed with procedure. - Educated patient on lifestyle modifications and provided patient education handouts - If persistent symptoms  could consider starting a PPI  Screening for colon cancer No previous history of screening.  No family history of colon cancer. - Schedule colonoscopy - I thoroughly discussed the procedure with the patient (at bedside) to include nature of the procedure, alternatives, benefits, and risks (including but not limited to bleeding, infection, perforation, anesthesia/cardiac pulmonary complications).  Patient verbalized understanding and gave verbal consent to proceed with procedure.   RUQ pain RUQ pain worse with movement and worse with eating fatty/greasy foods with RUQ ultrasound in 2022 showing no gallstones and normal liver.  Could be combination of musculoskeletal etiology with biliary dyskinesia especially since this seems to be exacerbated by greasy/fatty foods and resolves if he eats cleanly. - HIDA scan for further evaluation  Donzetta St[facility] 10/12/2023, 8:57 AM  Cc: Sharlene Dory*

## 2023-10-12 NOTE — Patient Instructions (Signed)
You have been scheduled for a HIDA scan at [facility] (1st floor) on 10/18/23. Please arrive 30 minutes prior to your scheduled appointment at  8:00 am. Make certain not to have anything to eat or drink after midnight prior to your test. Should this appointment date or time not work well for you, please call radiology scheduling at [phone].  _____________________________________________________________________ hepatobiliary (HIDA) scan is an imaging procedure used to diagnose problems in the liver, gallbladder and bile ducts. In the HIDA scan, a radioactive chemical or tracer is injected into a vein in your arm. The tracer is handled by the liver like bile. Bile is a fluid produced and excreted by your liver that helps your digestive system break down fats in the foods you eat. Bile is stored in your gallbladder and the gallbladder releases the bile when you eat a meal. A special nuclear medicine scanner (gamma camera) tracks the flow of the tracer from your liver into your gallbladder and small intestine.  During your HIDA scan  You'll be asked to change into a hospital gown before your HIDA scan begins. Your health care team will position you on a table, usually on your back. The radioactive tracer is then injected into a vein in your arm.The tracer travels through your bloodstream to your liver, where it's taken up by the bile-producing cells. The radioactive tracer travels with the bile from your liver into your gallbladder and through your bile ducts to your small intestine.You may feel some pressure while the radioactive tracer is injected into your vein. As you lie on the table, a special gamma camera is positioned over your abdomen taking pictures of the tracer as it moves through your body. The gamma camera takes pictures continually for about an hour. You'll need to keep still during the HIDA scan. This can become uncomfortable, but you may find that you can lessen the discomfort by  taking deep breaths and thinking about other things. Tell your health care team if you're uncomfortable. The radiologist will watch on a computer the progress of the radioactive tracer through your body. The HIDA scan may be stopped when the radioactive tracer is seen in the gallbladder and enters your small intestine. This typically takes about an hour. In some cases extra imaging will be performed if original images aren't satisfactory, if morphine is given to help visualize the gallbladder or if the medication CCK is given to look at the contraction of the gallbladder. This test typically takes 2 hours to complete. ________________________________________________________________________  Maurice Roberts have been scheduled for an endoscopy and colonoscopy. Please follow the written instructions given to you at your visit today.  Please pick up your prep supplies at the pharmacy within the next 1-3 days.  If you use inhalers (even only as needed), please bring them with you on the day of your procedure.  DO NOT TAKE 7 DAYS PRIOR TO TEST- Trulicity (dulaglutide) Ozempic, Wegovy (semaglutide) Mounjaro (tirzepatide) Bydureon Bcise (exanatide extended release)  DO NOT TAKE 1 DAY PRIOR TO YOUR TEST Rybelsus (semaglutide) Adlyxin (lixisenatide) Victoza (liraglutide) Byetta (exanatide) ___________________________________________________________________________   If your blood pressure at your visit was 140/90 or greater, please contact your primary care physician to follow up on this.  _______________________________________________________  If you are age 20 or older, your body mass index should be between 23-30. Your Body mass index is 32.99 kg/m. If this is out of the aforementioned range listed, please consider follow up with your Primary Care Provider.  If you are  age 31 or younger, your body mass index should be between 19-25. Your Body mass index is 32.99 kg/m. If this is out of the  aformentioned range listed, please consider follow up with your Primary Care Provider.   ________________________________________________________  The [facility] providers would like to encourage you to use [facility] to communicate with providers for non-urgent requests or questions.  Due to long hold times on the telephone, sending your provider a message by [facility] may be a faster and more efficient way to get a response.  Please allow 48 business hours for a response.  Please remember that this is for non-urgent requests.  _______________________________________________________

## 2023-10-18 ENCOUNTER — Encounter (HOSPITAL_COMMUNITY): Admission: RE | Admit: 2023-10-18 | Payer: No Typology Code available for payment source | Source: Ambulatory Visit

## 2023-11-16 ENCOUNTER — Encounter: Payer: Self-pay | Admitting: Gastroenterology

## 2023-11-18 NOTE — Progress Notes (Signed)
Agree with the assessment and plan as outlined by Boone Master, PA-C.  Nimrat Woolworth, DO, [facility]

## 2023-11-23 ENCOUNTER — Encounter: Payer: No Typology Code available for payment source | Admitting: Gastroenterology

## 2023-11-26 ENCOUNTER — Telehealth: Payer: Self-pay | Admitting: Gastroenterology

## 2023-11-26 NOTE — Telephone Encounter (Signed)
Returned the patient's phone phone call. He has a "deep" cough and nasal congestion. Rescheduled the patient for 01/02/24 and new times went over with the patient.

## 2023-11-26 NOTE — Telephone Encounter (Signed)
Inbound call from patient states he has Congestion and runny nose, would like to know if he can continue with endo colon for tomorrow.

## 2023-11-27 ENCOUNTER — Encounter: Payer: No Typology Code available for payment source | Admitting: Gastroenterology

## 2023-12-31 ENCOUNTER — Encounter: Payer: Self-pay | Admitting: Certified Registered Nurse Anesthetist

## 2024-01-02 ENCOUNTER — Ambulatory Visit: Payer: No Typology Code available for payment source | Admitting: Gastroenterology

## 2024-01-02 ENCOUNTER — Encounter: Payer: Self-pay | Admitting: Gastroenterology

## 2024-01-02 VITALS — BP 149/88 | HR 65 | Temp 98.6°F | Resp 21 | Ht 68.0 in | Wt 217.0 lb

## 2024-01-02 DIAGNOSIS — K2289 Other specified disease of esophagus: Secondary | ICD-10-CM

## 2024-01-02 DIAGNOSIS — D125 Benign neoplasm of sigmoid colon: Secondary | ICD-10-CM

## 2024-01-02 DIAGNOSIS — K5730 Diverticulosis of large intestine without perforation or abscess without bleeding: Secondary | ICD-10-CM

## 2024-01-02 DIAGNOSIS — K2970 Gastritis, unspecified, without bleeding: Secondary | ICD-10-CM

## 2024-01-02 DIAGNOSIS — K635 Polyp of colon: Secondary | ICD-10-CM | POA: Diagnosis not present

## 2024-01-02 DIAGNOSIS — K219 Gastro-esophageal reflux disease without esophagitis: Secondary | ICD-10-CM

## 2024-01-02 DIAGNOSIS — D122 Benign neoplasm of ascending colon: Secondary | ICD-10-CM | POA: Diagnosis not present

## 2024-01-02 DIAGNOSIS — K200 Eosinophilic esophagitis: Secondary | ICD-10-CM | POA: Diagnosis not present

## 2024-01-02 DIAGNOSIS — K2100 Gastro-esophageal reflux disease with esophagitis, without bleeding: Secondary | ICD-10-CM

## 2024-01-02 DIAGNOSIS — Z1211 Encounter for screening for malignant neoplasm of colon: Secondary | ICD-10-CM | POA: Diagnosis present

## 2024-01-02 DIAGNOSIS — R1310 Dysphagia, unspecified: Secondary | ICD-10-CM | POA: Diagnosis not present

## 2024-01-02 DIAGNOSIS — K640 First degree hemorrhoids: Secondary | ICD-10-CM

## 2024-01-02 DIAGNOSIS — R1011 Right upper quadrant pain: Secondary | ICD-10-CM

## 2024-01-02 DIAGNOSIS — K2990 Gastroduodenitis, unspecified, without bleeding: Secondary | ICD-10-CM | POA: Diagnosis not present

## 2024-01-02 DIAGNOSIS — R1319 Other dysphagia: Secondary | ICD-10-CM

## 2024-01-02 DIAGNOSIS — D124 Benign neoplasm of descending colon: Secondary | ICD-10-CM

## 2024-01-02 DIAGNOSIS — K222 Esophageal obstruction: Secondary | ICD-10-CM

## 2024-01-02 MED ORDER — SODIUM CHLORIDE 0.9 % IV SOLN
500.0000 mL | Freq: Once | INTRAVENOUS | Status: DC
Start: 2024-01-02 — End: 2024-01-02

## 2024-01-02 MED ORDER — PANTOPRAZOLE SODIUM 40 MG PO TBEC: 40.0000 mg | DELAYED_RELEASE_TABLET | Freq: Two times a day (BID) | ORAL | 1 refills | Status: AC

## 2024-01-02 NOTE — Op Note (Signed)
 [facility] Patient Name: Maurice Roberts Procedure Date: 01/02/2024 1:17 PM MRN: [id number] Endoscopist: Doristine Locks , MD, [id number] Age: 43 Referring MD:  Date of Birth: June 04, 1981 Gender: Male Account #: [id number] Procedure:                Upper GI endoscopy Indications:              Abdominal pain in the right upper quadrant,                            Dysphagia, Suspected esophageal reflux Medicines:                Monitored Anesthesia Care Procedure:                Pre-Anesthesia Assessment:                           - Prior to the procedure, a History and Physical                            was performed, and patient medications and                            allergies were reviewed. The patient's tolerance of                            previous anesthesia was also reviewed. The risks                            and benefits of the procedure and the sedation                            options and risks were discussed with the patient.                            All questions were answered, and informed consent                            was obtained. Prior Anticoagulants: The patient has                            taken no anticoagulant or antiplatelet agents. ASA                            Grade Assessment: II - A patient with mild systemic                            disease. After reviewing the risks and benefits,                            the patient was deemed in satisfactory condition to                            undergo the procedure.  After obtaining informed consent, the endoscope was                            passed under direct vision. Throughout the                            procedure, the patient's blood pressure, pulse, and                            oxygen saturations were monitored continuously. The                            Olympus Scope [id number] was introduced through the                            mouth, and advanced  to the second part of duodenum.                            The upper GI endoscopy was accomplished without                            difficulty. The patient tolerated the procedure                            well. Scope In: Scope Out: Findings:                 Mucosal changes including ringed esophagus and                            longitudinal furrows were found in the middle third                            of the esophagus and in the lower third of the                            esophagus. Esophageal findings were graded using                            the Eosinophilic Esophagitis Endoscopic Reference                            Score (EoE-EREFS) as: Edema Grade 1 Present                            (decreased clarity or absence of vascular                            markings), Rings Grade 2 Moderate (distinct rings                            that do not occlude passage of diagnostic 8-10 mm  endoscope), Exudates Grade 0 None (no white lesions                            seen), Furrows Grade 1 Mild (vertical lines without                            visible depth) and Stricture present. There was a                            dominant stricture in the distal esophagus, just                            proximal to the GEJ. A TTS dilator was passed                            through the scope. Dilation with a 15-16.5-18 mm                            balloon dilator was performed to 16.5 mm. The                            dilation site was examined and showed mild mucosal                            disruption and moderate improvement in luminal                            narrowing. Estimated blood loss was minimal. The                            inflated balloon was then dragged proximally                            through the esophagus for dilation of any more                            proximal strictures. Biopsies were then obtained                             from the proximal and distal esophagus with cold                            forceps for histology of suspected eosinophilic                            esophagitis. Estimated blood loss was minimal.                           The Z-line was found 43 cm from the incisors.                           Mild inflammation characterized by erythema was  found at the incisura and in the gastric antrum.                            Biopsies were taken with a cold forceps for                            Helicobacter pylori testing. Estimated blood loss                            was minimal.                           The gastric fundus and gastric body were normal.                           The examined duodenum was normal. Complications:            No immediate complications. Estimated Blood Loss:     Estimated blood loss was minimal. Impression:               - Esophageal mucosal changes consistent with                            eosinophilic esophagitis. Dilated with 16.5 mm TTS                            balloon with appropriate mucosal disruption x2.                            Biopsies were taken with a cold forceps for                            evaluation of eosinophilic esophagitis.                           - Z-line, 43 cm from the incisors.                           - Gastritis. Biopsied.                           - Normal gastric fundus and gastric body.                           - Normal examined duodenum. Recommendation:           - Patient has a contact number available for                            emergencies. The signs and symptoms of potential                            delayed complications were discussed with the                            patient. Return to normal activities tomorrow.  Written discharge instructions were provided to the                            patient.                           - Soft diet today.                            - Continue present medications.                           - Use Protonix (pantoprazole) 40 mg PO BID for 8                            weeks, then reduce to 40 mg daily.                           - Await pathology results.                           - Repeat upper endoscopy in 6-8 weeks to check                            healing and for retreatment.                           - Return to GI office in 3 months. Doristine Locks, MD 01/02/2024 2:09:31 PM

## 2024-01-02 NOTE — Progress Notes (Signed)
1316 Robinul 0.1 mg IV given due large amount of secretions upon assessment.  MD made aware, vss  ?

## 2024-01-02 NOTE — Progress Notes (Signed)
 Called to room to assist during endoscopic procedure.  Patient ID and intended procedure confirmed with present staff. Received instructions for my participation in the procedure from the performing physician.

## 2024-01-02 NOTE — Progress Notes (Signed)
 Report given to PACU, vss

## 2024-01-02 NOTE — Patient Instructions (Signed)
 Please read handouts provided. Soft diet today. Await pathology results. Protonix ( pantoprazole ) 40 mg twice daily for 8 weeks, then reduce to 40 mg daily. Upper endoscopy in 6-8 weeks. Return to GI  office in 3 months.   YOU HAD AN ENDOSCOPIC PROCEDURE TODAY AT THE [facility]:   Refer to the procedure report that was given to you for any specific questions about what was found during the examination.  If the procedure report does not answer your questions, please call your gastroenterologist to clarify.  If you requested that your care partner not be given the details of your procedure findings, then the procedure report has been included in a sealed envelope for you to review at your convenience later.  YOU SHOULD EXPECT: Some feelings of bloating in the abdomen. Passage of more gas than usual.  Walking can help get rid of the air that was put into your GI tract during the procedure and reduce the bloating. If you had a lower endoscopy (such as a colonoscopy or flexible sigmoidoscopy) you may notice spotting of blood in your stool or on the toilet paper. If you underwent a bowel prep for your procedure, you may not have a normal bowel movement for a few days.  Please Note:  You might notice some irritation and congestion in your nose or some drainage.  This is from the oxygen used during your procedure.  There is no need for concern and it should clear up in a day or so.  SYMPTOMS TO REPORT IMMEDIATELY:  Following lower endoscopy (colonoscopy or flexible sigmoidoscopy):  Excessive amounts of blood in the stool  Significant tenderness or worsening of abdominal pains  Swelling of the abdomen that is new, acute  Fever of 100F or higher  Following upper endoscopy (EGD)  Vomiting of blood or coffee ground material  New chest pain or pain under the shoulder blades  Painful or persistently difficult swallowing  New shortness of breath  Fever of 100F or higher  Black,  tarry-looking stools  For urgent or emergent issues, a gastroenterologist can be reached at any hour by calling [phone]. Do not use MyChart messaging for urgent concerns.    DIET:  We do recommend a small meal at first, but then you may proceed to your regular diet.  Drink plenty of fluids but you should avoid alcoholic beverages for 24 hours.  ACTIVITY:  You should plan to take it easy for the rest of today and you should NOT DRIVE or use heavy machinery until tomorrow (because of the sedation medicines used during the test).    FOLLOW UP: Our staff will call the number listed on your records the next business day following your procedure.  We will call around 7:15- 8:00 am to check on you and address any questions or concerns that you may have regarding the information given to you following your procedure. If we do not reach you, we will leave a message.     If any biopsies were taken you will be contacted by phone or by letter within the next 1-3 weeks.  Please call us at [phone] if you have not heard about the biopsies in 3 weeks.    SIGNATURES/CONFIDENTIALITY: You and/or your care partner have signed paperwork which will be entered into your electronic medical record.  These signatures attest to the fact that that the information above on your After Visit Summary has been reviewed and is understood.  Full responsibility of the  confidentiality of this discharge information lies with you and/or your care-partner.

## 2024-01-02 NOTE — Progress Notes (Signed)
 1328 Nasopharyngeal airway size 7.0  placed without trauma, vss

## 2024-01-02 NOTE — Op Note (Signed)
 [facility] Patient Name: Maurice Roberts Procedure Date: 01/02/2024 1:16 PM MRN: [id number] Endoscopist: Doristine Locks , MD, [id number] Age: 43 Referring MD:  Date of Birth: April 14, 1981 Gender: Male Account #: [id number] Procedure:                Colonoscopy Indications:              Screening for colorectal malignant neoplasm, This                            is the patient's first colonoscopy Medicines:                Monitored Anesthesia Care Procedure:                Pre-Anesthesia Assessment:                           - Prior to the procedure, a History and Physical                            was performed, and patient medications and                            allergies were reviewed. The patient's tolerance of                            previous anesthesia was also reviewed. The risks                            and benefits of the procedure and the sedation                            options and risks were discussed with the patient.                            All questions were answered, and informed consent                            was obtained. Prior Anticoagulants: The patient has                            taken no anticoagulant or antiplatelet agents. ASA                            Grade Assessment: II - A patient with mild systemic                            disease. After reviewing the risks and benefits,                            the patient was deemed in satisfactory condition to                            undergo the procedure.  After obtaining informed consent, the colonoscope                            was passed under direct vision. Throughout the                            procedure, the patient's blood pressure, pulse, and                            oxygen saturations were monitored continuously. The                            Olympus Scope SN: [id number] was introduced through                            the anus and advanced to  the the terminal ileum.                            The colonoscopy was performed without difficulty.                            The patient tolerated the procedure well. The                            quality of the bowel preparation was good. The                            terminal ileum, ileocecal valve, appendiceal                            orifice, and rectum were photographed. Scope In: 1:36:33 PM Scope Out: 1:50:16 PM Scope Withdrawal Time: 0 hours 12 minutes 0 seconds  Total Procedure Duration: 0 hours 13 minutes 43 seconds  Findings:                 The perianal and digital rectal examinations were                            normal.                           A 5 mm polyp was found in the ascending colon. The                            polyp was sessile. The polyp was removed with a                            cold snare. Resection and retrieval were complete.                            Estimated blood loss was minimal.                           Three sessile polyps were found in the sigmoid  colon and descending colon. The polyps were 2 to 3                            mm in size. These polyps were removed with a cold                            snare. Resection and retrieval were complete.                            Estimated blood loss was minimal.                           A few small-mouthed diverticula were found in the                            sigmoid colon and ascending colon.                           Non-bleeding internal hemorrhoids were found during                            retroflexion. The hemorrhoids were small and Grade                            I (internal hemorrhoids that do not prolapse).                           The terminal ileum appeared normal. Complications:            No immediate complications. Estimated Blood Loss:     Estimated blood loss was minimal. Impression:               - One 5 mm polyp in the ascending colon,  removed                            with a cold snare. Resected and retrieved.                           - Three 2 to 3 mm polyps in the sigmoid colon and                            in the descending colon, removed with a cold snare.                            Resected and retrieved.                           - Diverticulosis in the sigmoid colon and in the                            ascending colon.                           - Non-bleeding internal hemorrhoids.                           -  The examined portion of the ileum was normal. Recommendation:           - Patient has a contact number available for                            emergencies. The signs and symptoms of potential                            delayed complications were discussed with the                            patient. Return to normal activities tomorrow.                            Written discharge instructions were provided to the                            patient.                           - Resume previous diet.                           - Continue present medications.                           - Await pathology results.                           - Repeat colonoscopy for surveillance based on                            pathology results. Doristine Locks, MD 01/02/2024 2:12:42 PM

## 2024-01-02 NOTE — Progress Notes (Signed)
 1350 BP  161/120, Labetalol given IV, MD update, vss

## 2024-01-02 NOTE — Progress Notes (Signed)
 GASTROENTEROLOGY PROCEDURE H&P NOTE   Primary Care Physician: Sharlene Dory, DO    Reason for Procedure:  GERD, dysphagia, RUQ pain, colon cancer screening  Plan:    EGD, colonoscopy  Patient is appropriate for endoscopic procedure(s) in the ambulatory ([facility]) setting.  The nature of the procedure, as well as the risks, benefits, and alternatives were carefully and thoroughly reviewed with the patient. Ample time for discussion and questions allowed. The patient understood, was satisfied, and agreed to proceed.     HPI: Maurice Roberts is a 43 y.o. male who presents for EGD for evaluation of GERD, dysphagia, RUQ pain along with colonoscopy for routine CRC screening.  Patient states he has had intermittent dysphagia ongoing for many years that occurs about once a month.  He initially thought this was secondary to eating too quickly but then he noticed it would occur even if he was eating at a normal pace.  Dysphagia is only to solids.  Feels like it gets stuck in his mid chest.   He has intermittent GERD which only occurs with trigger foods.  He states if he eats clean he has no symptoms.   He also reports longstanding history of RUQ pain. Had a RUQ ultrasound in 2022 that showed no gallstones, normal liver. If he eats healthy he does not have this pain as much.   Past Medical History:  Diagnosis Date   History of chicken pox    Hyperhidrosis    Melanoma (HCC)    Tinea versicolor     Past Surgical History:  Procedure Laterality Date   KNEE SURGERY  1993    Prior to Admission medications   Medication Sig Start Date End Date Taking? Authorizing Provider  LORazepam (ATIVAN) 1 MG tablet Take 1 tablet by mouth before flying. 02/16/23   Sharlene Dory, DO    Current Outpatient Medications  Medication Sig Dispense Refill   LORazepam (ATIVAN) 1 MG tablet Take 1 tablet by mouth before flying. 20 tablet 1   Current Facility-Administered Medications  Medication  Dose Route Frequency Provider Last Rate Last Admin   0.9 %  sodium chloride infusion  500 mL Intravenous Once Copeland Lapier V, DO        Allergies as of 01/02/2024 - Review Complete 01/02/2024  Allergen Reaction Noted   Penicillins Other (See Comments) 03/13/2017    Family History  Problem Relation Age of Onset   Cancer Maternal Grandmother        Breast    Pancreatic cancer Neg Hx    Stomach cancer Neg Hx    Colon cancer Neg Hx    Esophageal cancer Neg Hx    Rectal cancer Neg Hx     Social History   Socioeconomic History   Marital status: Married    Spouse name: Not on file   Number of children: Not on file   Years of education: Not on file   Highest education level: Not on file  Occupational History   Not on file  Tobacco Use   Smoking status: Former    Current packs/day: 0.00    Types: Cigarettes    Quit date: 03/23/2002    Years since quitting: 21.7   Smokeless tobacco: Never  Vaping Use   Vaping status: Never Used  Substance and Sexual Activity   Alcohol use: Yes    Alcohol/week: 2.0 standard drinks of alcohol    Types: 2 Cans of beer per week    Comment: Pt now drinking 5  per week   Drug use: No   Sexual activity: Not on file  Other Topics Concern   Not on file  Social History Narrative   Not on file   Social Drivers of Health   Financial Resource Strain: Not on file  Food Insecurity: Not on file  Transportation Needs: Not on file  Physical Activity: Not on file  Stress: Not on file  Social Connections: Not on file  Intimate Partner Violence: Not on file    Physical Exam: Vital signs in last 24 hours: @BP  133/75   Pulse 81   Temp 98.6 F (37 C) (Temporal)   Ht 5\' 8"  (1.727 m)   Wt 217 lb (98.4 kg)   SpO2 98%   BMI 32.99 kg/m  GEN: NAD EYE: Sclerae anicteric ENT: MMM CV: Non-tachycardic Pulm: CTA b/l GI: Soft, NT/ND NEURO:  Alert & Oriented x 3   Doristine Locks, DO [facility]   01/02/2024 12:55 PM

## 2024-01-03 ENCOUNTER — Telehealth: Payer: Self-pay | Admitting: *Deleted

## 2024-01-03 NOTE — Telephone Encounter (Signed)
  Follow up Call-     01/02/2024   12:31 PM  Call back number  Post procedure Call Back phone  # [phone]  Permission to leave phone message Yes     Patient questions:  Do you have a fever, pain , or abdominal swelling? No. Pain Score  0 *  Have you tolerated food without any problems? Yes.    Have you been able to return to your normal activities? Yes.    Do you have any questions about your discharge instructions: Diet   No. Medications  No. Follow up visit  No.  Do you have questions or concerns about your Care? No.  Actions: * If pain score is 4 or above: No action needed, pain <4.

## 2024-01-07 LAB — SURGICAL PATHOLOGY

## 2024-01-16 ENCOUNTER — Encounter: Payer: Self-pay | Admitting: Gastroenterology

## 2024-01-28 ENCOUNTER — Telehealth: Payer: Self-pay | Admitting: Gastroenterology

## 2024-01-28 NOTE — Telephone Encounter (Signed)
 Returned call to patient. Patient states that he did not remember why he was having a repeat EGD in May, he states it was probably discussed when he was coming out of anesthesia. I advised that it was to check for healing of EoE. I have re-sent instructions to patient via MyChart. Patient verbalized understanding and had no concerns at the end of the call.

## 2024-01-28 NOTE — Telephone Encounter (Signed)
 Patient called and stated that he is needing a little more information on what he needs to do before he has EGD done on may the 1 st. Patient is requesting a call back. Please advise.

## 2024-02-21 ENCOUNTER — Ambulatory Visit: Admitting: Gastroenterology

## 2024-02-21 ENCOUNTER — Encounter: Payer: Self-pay | Admitting: Gastroenterology

## 2024-02-21 VITALS — BP 121/93 | HR 68 | Temp 98.0°F | Resp 13 | Ht 68.0 in | Wt 217.0 lb

## 2024-02-21 DIAGNOSIS — K229 Disease of esophagus, unspecified: Secondary | ICD-10-CM | POA: Diagnosis not present

## 2024-02-21 DIAGNOSIS — K219 Gastro-esophageal reflux disease without esophagitis: Secondary | ICD-10-CM | POA: Diagnosis present

## 2024-02-21 DIAGNOSIS — R1319 Other dysphagia: Secondary | ICD-10-CM

## 2024-02-21 DIAGNOSIS — K2970 Gastritis, unspecified, without bleeding: Secondary | ICD-10-CM

## 2024-02-21 MED ORDER — SODIUM CHLORIDE 0.9 % IV SOLN: 500.0000 mL | Freq: Once | INTRAVENOUS | Status: DC

## 2024-02-21 NOTE — Progress Notes (Signed)
 GASTROENTEROLOGY PROCEDURE H&P NOTE   Primary Care Physician: Jobe Mulder, DO    Reason for Procedure:  GERD with erosive esophagitis, peptic stricture  Plan:    EGD with dilation and/or biopsy  Patient is appropriate for endoscopic procedure(s) in the ambulatory ([facility]) setting.  The nature of the procedure, as well as the risks, benefits, and alternatives were carefully and thoroughly reviewed with the patient. Ample time for discussion and questions allowed. The patient understood, was satisfied, and agreed to proceed.     HPI: Maurice Roberts is a 43 y.o. male who presents for EGD for reevaluation.  EGD on 01/02/2024 notable for felinization and longitudinal furrows (biopsies with 30 eos/hpf in distal esophagus and mild congestion without eosinophils in the proximal esophagus, more consistent with reflux rather than EOE), with a dominant stricture in the distal esophagus just proximal to the GEJ, dilated with a 16.5 mm TTS balloon with appropriate mucosal disruption, mild gastritis, normal duodenum.  He was treated with high-dose PPI and presents today for reevaluation.  He reports excellent response to EGD with dilation and high-dose PPI with resolution of dysphagia and no recent reflux symptoms.  Past Medical History:  Diagnosis Date   History of chicken pox    Hyperhidrosis    Melanoma (HCC)    Tinea versicolor     Past Surgical History:  Procedure Laterality Date   KNEE SURGERY  1993    Prior to Admission medications   Medication Sig Start Date End Date Taking? Authorizing Provider  pantoprazole  (PROTONIX ) 40 MG tablet Take 1 tablet (40 mg total) by mouth 2 (two) times daily. Protonix  40 mg twice daily for 8 weeks, then reduce to once daily. 01/02/24  Yes Evagelia Knack V, DO  LORazepam  (ATIVAN ) 1 MG tablet Take 1 tablet by mouth before flying. 02/16/23   Jobe Mulder, DO    Current Outpatient Medications  Medication Sig Dispense Refill    pantoprazole  (PROTONIX ) 40 MG tablet Take 1 tablet (40 mg total) by mouth 2 (two) times daily. Protonix  40 mg twice daily for 8 weeks, then reduce to once daily. 180 tablet 1   LORazepam  (ATIVAN ) 1 MG tablet Take 1 tablet by mouth before flying. 20 tablet 1   Current Facility-Administered Medications  Medication Dose Route Frequency Provider Last Rate Last Admin   0.9 %  sodium chloride  infusion  500 mL Intravenous Once Destiney Sanabia V, DO        Allergies as of 02/21/2024 - Review Complete 02/21/2024  Allergen Reaction Noted   Penicillins Other (See Comments) 03/13/2017    Family History  Problem Relation Age of Onset   Cancer Maternal Grandmother        Breast    Pancreatic cancer Neg Hx    Stomach cancer Neg Hx    Colon cancer Neg Hx    Esophageal cancer Neg Hx    Rectal cancer Neg Hx     Social History   Socioeconomic History   Marital status: Married    Spouse name: Not on file   Number of children: Not on file   Years of education: Not on file   Highest education level: Not on file  Occupational History   Not on file  Tobacco Use   Smoking status: Former    Current packs/day: 0.00    Types: Cigarettes    Quit date: 03/23/2002    Years since quitting: 21.9   Smokeless tobacco: Never  Vaping Use   Vaping status: Never  Used  Substance and Sexual Activity   Alcohol use: Yes    Alcohol/week: 2.0 standard drinks of alcohol    Types: 2 Cans of beer per week    Comment: Pt now drinking 5 per week   Drug use: No   Sexual activity: Not on file  Other Topics Concern   Not on file  Social History Narrative   Not on file   Social Drivers of Health   Financial Resource Strain: Not on file  Food Insecurity: Not on file  Transportation Needs: Not on file  Physical Activity: Not on file  Stress: Not on file  Social Connections: Not on file  Intimate Partner Violence: Not on file    Physical Exam: Vital signs in last 24 hours: @BP  126/85   Pulse 66   Temp 98  F (36.7 C) (Temporal)   Resp (!) 9   Ht 5\' 8"  (1.727 m)   Wt 217 lb (98.4 kg)   SpO2 100%   BMI 32.99 kg/m  GEN: NAD EYE: Sclerae anicteric ENT: MMM CV: Non-tachycardic Pulm: CTA b/l GI: Soft, NT/ND NEURO:  Alert & Oriented x 3   Harry Lindau, DO [facility]   02/21/2024 8:02 AM

## 2024-02-21 NOTE — Patient Instructions (Addendum)
 Thank you for letting us  take care of your healthcare needs today. Reduce Pantoprazole  to 40 mg daily starting next week.     YOU HAD AN ENDOSCOPIC PROCEDURE TODAY AT THE [facility]:   Refer to the procedure report that was given to you for any specific questions about what was found during the examination.  If the procedure report does not answer your questions, please call your gastroenterologist to clarify.  If you requested that your care partner not be given the details of your procedure findings, then the procedure report has been included in a sealed envelope for you to review at your convenience later.  YOU SHOULD EXPECT: Some feelings of bloating in the abdomen. Passage of more gas than usual.  Walking can help get rid of the air that was put into your GI tract during the procedure and reduce the bloating. If you had a lower endoscopy (such as a colonoscopy or flexible sigmoidoscopy) you may notice spotting of blood in your stool or on the toilet paper. If you underwent a bowel prep for your procedure, you may not have a normal bowel movement for a few days.  Please Note:  You might notice some irritation and congestion in your nose or some drainage.  This is from the oxygen used during your procedure.  There is no need for concern and it should clear up in a day or so.  SYMPTOMS TO REPORT IMMEDIATELY:  Following upper endoscopy (EGD)  Vomiting of blood or coffee ground material  New chest pain or pain under the shoulder blades  Painful or persistently difficult swallowing  New shortness of breath  Fever of 100F or higher  Black, tarry-looking stools  For urgent or emergent issues, a gastroenterologist can be reached at any hour by calling [phone]. Do not use MyChart messaging for urgent concerns.    DIET:  We do recommend a small meal at first, but then you may proceed to your regular diet.  Drink plenty of fluids but you should avoid alcoholic beverages for  24 hours.  ACTIVITY:  You should plan to take it easy for the rest of today and you should NOT DRIVE or use heavy machinery until tomorrow (because of the sedation medicines used during the test).    FOLLOW UP: Our staff will call the number listed on your records the next business day following your procedure.  We will call around 7:15- 8:00 am to check on you and address any questions or concerns that you may have regarding the information given to you following your procedure. If we do not reach you, we will leave a message.     If any biopsies were taken you will be contacted by phone or by letter within the next 1-3 weeks.  Please call us  at [phone] if you have not heard about the biopsies in 3 weeks.    SIGNATURES/CONFIDENTIALITY: You and/or your care partner have signed paperwork which will be entered into your electronic medical record.  These signatures attest to the fact that that the information above on your After Visit Summary has been reviewed and is understood.  Full responsibility of the confidentiality of this discharge information lies with you and/or your care-partner.

## 2024-02-21 NOTE — Progress Notes (Signed)
 Sedate, gd SR, tolerated procedure well, VSS, report to RN

## 2024-02-21 NOTE — Op Note (Signed)
 [facility] Patient Name: Maurice Roberts Procedure Date: 02/21/2024 7:56 AM MRN: [id number] Endoscopist: Harry Lindau , MD, [id number] Age: 43 Referring MD:  Date of Birth: 1981/03/04 Gender: Male Account #: [id number] Procedure:                Upper GI endoscopy Indications:              Dysphagia, Esophageal reflux, Follow-up of                            esophageal stricture                           44 y.o. male who presents for EGD for reevaluation.                           EGD on 01/02/2024 notable for felinization and                            longitudinal furrows (biopsies with 30 eos/hpf in                            distal esophagus and mild congestion without                            eosinophils in the proximal esophagus, more                            consistent with reflux rather than EOE), with a                            dominant stricture in the distal esophagus just                            proximal to the GEJ, dilated with a 16.5 mm TTS                            balloon with appropriate mucosal disruption, mild                            gastritis, normal duodenum. He was treated with                            high-dose PPI and presents today for reevaluation.                           He reports excellent response to EGD with dilation                            and high-dose PPI with resolution of dysphagia and                            no recent reflux symptoms. Medicines:                Monitored Anesthesia Care Procedure:  Pre-Anesthesia Assessment:                           - Prior to the procedure, a History and Physical                            was performed, and patient medications and                            allergies were reviewed. The patient's tolerance of                            previous anesthesia was also reviewed. The risks                            and benefits of the procedure and the sedation                             options and risks were discussed with the patient.                            All questions were answered, and informed consent                            was obtained. Prior Anticoagulants: The patient has                            taken no anticoagulant or antiplatelet agents. ASA                            Grade Assessment: II - A patient with mild systemic                            disease. After reviewing the risks and benefits,                            the patient was deemed in satisfactory condition to                            undergo the procedure.                           After obtaining informed consent, the endoscope was                            passed under direct vision. Throughout the                            procedure, the patient's blood pressure, pulse, and                            oxygen saturations were monitored continuously. The  Olympus Scope [id number] was introduced through the                            mouth, and advanced to the second part of duodenum.                            The upper GI endoscopy was accomplished without                            difficulty. The patient tolerated the procedure                            well. Scope In: Scope Out: Findings:                 The Z-line was regular and was found 43 cm from the                            incisors.                           Normal mucosa was found in the entire esophagus.                            Biopsies were taken with a cold forceps for                            histology. Estimated blood loss was minimal.                           The gastroesophageal flap valve was visualized                            endoscopically and classified as Hill Grade III                            (minimal fold, loose to endoscope, hiatal hernia                            likely).                           The entire examined stomach was normal.                            The examined duodenum was normal. Complications:            No immediate complications. Estimated Blood Loss:     Estimated blood loss was minimal. Impression:               - Z-line regular, 43 cm from the incisors.                           - Normal mucosa was found in the entire esophagus.  Biopsied.                           - Gastroesophageal flap valve classified as Hill                            Grade III (minimal fold, loose to endoscope, hiatal                            hernia likely).                           - Normal stomach.                           - Normal examined duodenum. Recommendation:           - Patient has a contact number available for                            emergencies. The signs and symptoms of potential                            delayed complications were discussed with the                            patient. Return to normal activities tomorrow.                            Written discharge instructions were provided to the                            patient.                           - Resume previous diet.                           - Await pathology results.                           - Reduce Protonix  (pantoprazole ) to 40 mg PO daily                            next week, and will titrate to effective control of                            reflux symptoms.                           - Return to GI clinic in 3 months or sooner as                            needed.                           - Repeat upper endoscopy PRN for retreatment. Harry Lindau, MD 02/21/2024  8:28:03 AM

## 2024-02-21 NOTE — Progress Notes (Signed)
 Called to room to assist during endoscopic procedure.  Patient ID and intended procedure confirmed with present staff. Received instructions for my participation in the procedure from the performing physician.

## 2024-02-22 ENCOUNTER — Telehealth: Payer: Self-pay | Admitting: *Deleted

## 2024-02-22 NOTE — Telephone Encounter (Signed)
 Left message on f/u call

## 2024-02-25 LAB — SURGICAL PATHOLOGY

## 2024-03-01 ENCOUNTER — Encounter: Payer: Self-pay | Admitting: Gastroenterology

## 2024-04-17 ENCOUNTER — Ambulatory Visit (INDEPENDENT_AMBULATORY_CARE_PROVIDER_SITE_OTHER): Admitting: Gastroenterology

## 2024-04-17 ENCOUNTER — Encounter: Payer: Self-pay | Admitting: Gastroenterology

## 2024-04-17 VITALS — BP 120/84 | HR 66 | Ht 68.0 in | Wt 218.0 lb

## 2024-04-17 DIAGNOSIS — R1011 Right upper quadrant pain: Secondary | ICD-10-CM | POA: Diagnosis not present

## 2024-04-17 DIAGNOSIS — K222 Esophageal obstruction: Secondary | ICD-10-CM | POA: Diagnosis not present

## 2024-04-17 DIAGNOSIS — K219 Gastro-esophageal reflux disease without esophagitis: Secondary | ICD-10-CM | POA: Diagnosis not present

## 2024-04-17 DIAGNOSIS — Z860100 Personal history of colon polyps, unspecified: Secondary | ICD-10-CM

## 2024-04-17 NOTE — Progress Notes (Signed)
 Chief Complaint:    Dysphagia, GERD  GI History: 43 year old male with history of GERD complicated by peptic stricture and dysphagia.  -01/02/2024: EGD: felinization and longitudinal furrows (biopsies with 30 eos/hpf in distal esophagus and mild congestion without eosinophils in the proximal esophagus, more consistent with reflux rather than EOE), with a dominant stricture in the distal esophagus just proximal to the GEJ, dilated with a 16.5 mm TTS balloon with appropriate mucosal disruption, mild gastritis, normal duodenum. He was treated with high-dose PPI  - 01/02/2024: Colonoscopy: 5 mm ascending colon SSP, 3 small 2-3 mm hyperplastic polyps removed from sigmoid and descending colon, sigmoid/ascending diverticulosis, small internal hemorrhoids.  Normal TI.  Repeat 5 years - 02/21/2024: EGD: Normal esophagus (path: 10 eosinophils in the distal esophagus, no eosinophils in the proximal esophagus), Hill grade 3 valve, normal stomach and duodenum.  Reduced Protonix  to 40 mg daily.  Histologically more consistent with reflux rather than EOE   Separately, longstanding history of intermittent RUQ pain.  Worse with fatty/greasy foods.  RUQ US  in 2022 essentially normal.  Ordered HIDA in 09/2023, but patient never scheduled.  HPI:     Patient is a 43 y.o. male presenting to the Gastroenterology Clinic for follow-up.   Doing well since EGD with dilation.  Swallowing much improved and no dysphagia since then.  Symptoms have essentially completely resolved.   Review of systems:     No chest pain, no SOB, no fevers, no urinary sx   Past Medical History:  Diagnosis Date   History of chicken pox    Hyperhidrosis    Melanoma (HCC)    Tinea versicolor     Patient's surgical history, family medical history, social history, medications and allergies were all reviewed in Epic    Current Outpatient Medications  Medication Sig Dispense Refill   LORazepam  (ATIVAN ) 1 MG tablet Take 1 tablet by mouth  before flying. 20 tablet 1   pantoprazole  (PROTONIX ) 40 MG tablet Take 1 tablet (40 mg total) by mouth 2 (two) times daily. Protonix  40 mg twice daily for 8 weeks, then reduce to once daily. 180 tablet 1   No current facility-administered medications for this visit.    Physical Exam:     BP 120/84   Pulse 66   Ht 5' 8 (1.727 m)   Wt 218 lb (98.9 kg)   BMI 33.15 kg/m   GENERAL:  Pleasant male in NAD PSYCH: : Cooperative, normal affect Musculoskeletal:  Normal muscle tone, normal strength NEURO: Alert and oriented x 3, no focal neurologic deficits   IMPRESSION and PLAN:    1) GERD 2) Peptic stricture Excellent clinical response to EGD with dilation and high-dose PPI.  Has since reduce Protonix  to 40 mg daily and still with no return of dysphagia or reflux symptoms. - Continue Protonix  40 mg daily - Continue antireflux lifestyle/dietary modifications - To call me if any return of dysphagia that would require repeat endoscopy with dilation - Labs at follow-up for chronic PPI use  3) History of colon polyps - Repeat colonoscopy in 12/2028 for ongoing polyp surveillance  4) RUQ pain Longstanding history of intermittent RUQ pain.  Tends to be worse with greasy, heavy, fried foods.  Has not had symptoms recently as he has been avoiding those types of foods.  RUQ US  in 2022 was essentially normal and patient declined HIDA as symptoms responded to dietary modification. - Continue healthy eating - To call me if symptoms recur and plan for HIDA  RTC in 1 year or sooner as needed           Sandor LULLA Flatter ,DO, FACG 04/17/2024, 3:43 PM

## 2024-04-17 NOTE — Patient Instructions (Signed)
 _______________________________________________________  If your blood pressure at your visit was 140/90 or greater, please contact your primary care physician to follow up on this. _______________________________________________________  If you are age 45 or older, your body mass index should be between 23-30. Your Body mass index is 33.15 kg/m. If this is out of the aforementioned range listed, please consider follow up with your Primary Care Provider.  If you are age 59 or younger, your body mass index should be between 19-25. Your Body mass index is 33.15 kg/m. If this is out of the aformentioned range listed, please consider follow up with your Primary Care Provider.  ________________________________________________________  The [facility] providers would like to encourage you to use MYCHART to communicate with providers for non-urgent requests or questions.  Due to long hold times on the telephone, sending your provider a message by [facility] may be a faster and more efficient way to get a response.  Please allow 48 business hours for a response.  Please remember that this is for non-urgent requests.  _______________________________________________________  Rosine will follow up in our office in 1 year.  It was a pleasure to see you today!  Vito Cirigliano, D.O.

## 2024-08-22 ENCOUNTER — Ambulatory Visit: Payer: No Typology Code available for payment source | Admitting: Family Medicine

## 2024-08-22 ENCOUNTER — Encounter: Payer: Self-pay | Admitting: Family Medicine

## 2024-08-22 ENCOUNTER — Ambulatory Visit: Payer: Self-pay | Admitting: Family Medicine

## 2024-08-22 ENCOUNTER — Other Ambulatory Visit: Payer: Self-pay

## 2024-08-22 VITALS — BP 118/74 | HR 76 | Temp 98.0°F | Resp 16 | Ht 68.0 in | Wt 220.2 lb

## 2024-08-22 DIAGNOSIS — Z125 Encounter for screening for malignant neoplasm of prostate: Secondary | ICD-10-CM

## 2024-08-22 DIAGNOSIS — Z23 Encounter for immunization: Secondary | ICD-10-CM

## 2024-08-22 DIAGNOSIS — E7800 Pure hypercholesterolemia, unspecified: Secondary | ICD-10-CM

## 2024-08-22 DIAGNOSIS — Z0000 Encounter for general adult medical examination without abnormal findings: Secondary | ICD-10-CM | POA: Diagnosis not present

## 2024-08-22 LAB — COMPREHENSIVE METABOLIC PANEL WITH GFR
ALT: 44 U/L (ref 0–53)
AST: 22 U/L (ref 0–37)
Albumin: 4.6 g/dL (ref 3.5–5.2)
Alkaline Phosphatase: 46 U/L (ref 39–117)
BUN: 15 mg/dL (ref 6–23)
CO2: 26 meq/L (ref 19–32)
Calcium: 9.2 mg/dL (ref 8.4–10.5)
Chloride: 106 meq/L (ref 96–112)
Creatinine, Ser: 0.83 mg/dL (ref 0.40–1.50)
GFR: 105.5 mL/min (ref >60.00)
Glucose, Bld: 95 mg/dL (ref 70–99)
Potassium: 4 meq/L (ref 3.5–5.1)
Sodium: 140 meq/L (ref 135–145)
Total Bilirubin: 0.8 mg/dL (ref 0.2–1.2)
Total Protein: 7 g/dL (ref 6.0–8.3)

## 2024-08-22 LAB — PSA: PSA: 0.49 ng/mL (ref 0.10–4.00)

## 2024-08-22 LAB — CBC
HCT: 46.8 % (ref 39.0–52.0)
Hemoglobin: 16.2 g/dL (ref 13.0–17.0)
MCHC: 34.5 g/dL (ref 30.0–36.0)
MCV: 89.9 fl (ref 78.0–100.0)
Platelets: 200 K/uL (ref 150.0–400.0)
RBC: 5.2 Mil/uL (ref 4.22–5.81)
RDW: 12.3 % (ref 11.5–15.5)
WBC: 5.5 K/uL (ref 4.0–10.5)

## 2024-08-22 LAB — LIPID PANEL
Cholesterol: 213 mg/dL — ABNORMAL HIGH (ref 0–200)
HDL: 53.6 mg/dL (ref >39.00)
LDL Cholesterol: 141 mg/dL — ABNORMAL HIGH (ref 0–99)
NonHDL: 159.68
Total CHOL/HDL Ratio: 4
Triglycerides: 92 mg/dL (ref 0.0–149.0)
VLDL: 18.4 mg/dL (ref 0.0–40.0)

## 2024-08-22 NOTE — Progress Notes (Signed)
 Chief Complaint  Patient presents with   Annual Exam    CPE    Well Male Maurice Roberts is here for a complete physical.   His last physical was >1 year ago.  Current diet: in general, diet is OK.   Current exercise: running, walking, some lifting Weight trend: increased a little Fatigue out of ordinary? No. Seat belt? Yes.   Advanced directive? No  Health maintenance Tetanus- Yes HIV- Yes Hep C- Yes  Past Medical History:  Diagnosis Date   History of chicken pox    Hyperhidrosis    Melanoma (HCC)    Tinea versicolor      Past Surgical History:  Procedure Laterality Date   KNEE SURGERY  1993    Medications  Current Outpatient Medications on File Prior to Visit  Medication Sig Dispense Refill   LORazepam  (ATIVAN ) 1 MG tablet Take 1 tablet by mouth before flying. 20 tablet 1   pantoprazole  (PROTONIX ) 40 MG tablet Take 1 tablet (40 mg total) by mouth 2 (two) times daily. Protonix  40 mg twice daily for 8 weeks, then reduce to once daily. 180 tablet 1    Allergies Allergies  Allergen Reactions   Penicillins Other (See Comments)    Unsure of allergy - childhood reaction - possible rash    Family History Family History  Problem Relation Age of Onset   Cancer Maternal Grandmother        Breast    Pancreatic cancer Neg Hx    Stomach cancer Neg Hx    Colon cancer Neg Hx    Esophageal cancer Neg Hx    Rectal cancer Neg Hx     Review of Systems: Constitutional: no fevers or chills Eye:  no recent significant change in vision Ear/Nose/Mouth/Throat:  Ears:  no hearing loss Nose/Mouth/Throat:  no complaints of nasal congestion, no sore throat Cardiovascular:  no chest pain Respiratory:  no shortness of breath Gastrointestinal:  no abdominal pain, no change in bowel habits GU:  Male: negative for dysuria, frequency, and incontinence Musculoskeletal/Extremities:  no pain of the joints Integumentary (Skin/Breast):  no abnormal skin lesions reported Neurologic:  no  headaches Endocrine: No unexpected weight changes Hematologic/Lymphatic:  no night sweats  Exam BP 118/74 (BP Location: Left Arm, Patient Position: Sitting)   Pulse 76   Temp 98 F (36.7 C) (Oral)   Resp 16   Ht 5' 8 (1.727 m)   Wt 220 lb 3.2 oz (99.9 kg)   SpO2 97%   BMI 33.48 kg/m  General:  well developed, well nourished, in no apparent distress Skin:  no significant moles, warts, or growths Head:  no masses, lesions, or tenderness Eyes:  pupils equal and round, sclera anicteric without injection Ears:  canals without lesions, TMs shiny without retraction, no obvious effusion, no erythema Nose:  nares patent, mucosa normal Throat/Pharynx:  lips and gingiva without lesion; tongue and uvula midline; non-inflamed pharynx; no exudates or postnasal drainage Neck: neck supple without adenopathy, thyromegaly, or masses Lungs:  clear to auscultation, breath sounds equal bilaterally, no respiratory distress Cardio:  regular rate and rhythm, no bruits, no LE edema Abdomen:  abdomen soft, nontender; bowel sounds normal; no masses or organomegaly Rectal: Deferred Musculoskeletal:  symmetrical muscle groups noted without atrophy or deformity Extremities:  no clubbing, cyanosis, or edema, no deformities, no skin discoloration Neuro:  gait normal; deep tendon reflexes normal and symmetric Psych: well oriented with normal range of affect and appropriate judgment/insight  Assessment and Plan  Well adult  exam - Plan: CBC, Comprehensive metabolic panel with GFR, Lipid panel, Hepatitis B surface antibody,quantitative  Screening for prostate cancer - Plan: PSA   Well 43 y.o. male. Counseled on diet and exercise. Counseled on risks and benefits of prostate cancer screening with PSA. The patient agrees to undergo screening.  Flu shot today. Advanced directive form provided today.  Discussed Hep B screening.  Other orders as above. Follow up in 1 year pending the above workup. The patient  voiced understanding and agreement to the plan.  Mabel [place], DO 08/22/24 7:43 AM

## 2024-08-22 NOTE — Patient Instructions (Addendum)
 Give [place] 2-3 business days to get the results of your labs back.   Keep the diet clean and stay active.  Please get me a copy of your advanced directive form at your convenience.   Let us know if you need anything.

## 2024-08-22 NOTE — Addendum Note (Signed)
 Addended by: Efraim Vanallen M on: 08/22/2024 08:19 AM   Modules accepted: Orders

## 2024-08-23 LAB — HEPATITIS B SURFACE ANTIBODY, QUANTITATIVE: Hep B S AB Quant (Post): >1000 m[IU]/mL (ref >=10)

## 2024-09-17 ENCOUNTER — Other Ambulatory Visit: Payer: Self-pay | Admitting: Family Medicine

## 2024-09-17 DIAGNOSIS — F418 Other specified anxiety disorders: Secondary | ICD-10-CM

## 2024-09-17 MED ORDER — LORAZEPAM 1 MG PO TABS: ORAL_TABLET | ORAL | 1 refills | Status: AC

## 2024-09-17 NOTE — Telephone Encounter (Signed)
 Copied from CRM #[id number]. Topic: Clinical - Medication Refill >> Sep 17, 2024 12:35 PM Victoria A wrote: Medication: LORazepam  (ATIVAN ) 1 MG tablet  Has the patient contacted their pharmacy? No (Agent: If no, request that the patient contact the pharmacy for the refill. If patient does not wish to contact the pharmacy document the reason why and proceed with request.) (Agent: If yes, when and what did the pharmacy advise?)  This is the patient's preferred pharmacy:  [id number] - [place], [place] - [address] [place] [address] [place] [address] [place] [address] Phone: [phone] Fax: [phone]  Is this the correct pharmacy for this prescription? Yes If no, delete pharmacy and type the correct one.   Has the prescription been filled recently? No  Is the patient out of the medication? No Has a few left  Has the patient been seen for an appointment in the last year OR does the patient have an upcoming appointment? Yes  Can we respond through MyChart? Yes  Agent: Please be advised that Rx refills may take up to 3 business days. We ask that you follow-up with your pharmacy.

## 2024-09-17 NOTE — Telephone Encounter (Signed)
 Requesting: lorazepam  1mg  Contract: None UDS: None Last Visit: 08/22/24 Next Visit:  08/24/25 Last Refill: 02/16/23 #20 and 1RF   Please Advise

## 2024-09-29 ENCOUNTER — Ambulatory Visit: Payer: Self-pay | Admitting: Family Medicine

## 2024-09-29 ENCOUNTER — Other Ambulatory Visit (INDEPENDENT_AMBULATORY_CARE_PROVIDER_SITE_OTHER)

## 2024-09-29 DIAGNOSIS — E7800 Pure hypercholesterolemia, unspecified: Secondary | ICD-10-CM | POA: Diagnosis not present

## 2024-09-29 LAB — LIPID PANEL
Cholesterol: 216 mg/dL — ABNORMAL HIGH (ref 0–200)
HDL: 48.1 mg/dL (ref >39.00)
LDL Cholesterol: 147 mg/dL — ABNORMAL HIGH (ref 0–99)
NonHDL: 167.43
Total CHOL/HDL Ratio: 4
Triglycerides: 101 mg/dL (ref 0.0–149.0)
VLDL: 20.2 mg/dL (ref 0.0–40.0)

## 2025-08-24 ENCOUNTER — Encounter: Admitting: Family Medicine
